# Patient Record
Sex: Female | Born: 1960 | Hispanic: No | Smoking: Never smoker
Health system: Southern US, Community
[De-identification: ages and names within clinical notes are randomized; demographics above are authoritative.]

## PROBLEM LIST (undated history)

## (undated) DIAGNOSIS — F419 Anxiety disorder, unspecified: Secondary | ICD-10-CM

## (undated) DIAGNOSIS — M1990 Unspecified osteoarthritis, unspecified site: Secondary | ICD-10-CM

## (undated) HISTORY — DX: Unspecified osteoarthritis, unspecified site: M19.90

## (undated) HISTORY — PX: TUBAL LIGATION: SHX77

## (undated) HISTORY — DX: Anxiety disorder, unspecified: F41.9

---

## 2008-12-24 ENCOUNTER — Ambulatory Visit (HOSPITAL_COMMUNITY): Admission: RE | Admit: 2008-12-24 | Discharge: 2008-12-24 | Payer: Self-pay | Admitting: Obstetrics and Gynecology

## 2008-12-24 ENCOUNTER — Encounter (INDEPENDENT_AMBULATORY_CARE_PROVIDER_SITE_OTHER): Payer: Self-pay | Admitting: Obstetrics and Gynecology

## 2008-12-27 ENCOUNTER — Emergency Department (HOSPITAL_COMMUNITY): Admission: EM | Admit: 2008-12-27 | Discharge: 2008-12-28 | Payer: Self-pay | Admitting: Emergency Medicine

## 2009-06-20 HISTORY — PX: ABDOMINAL HYSTERECTOMY: SHX81

## 2009-06-23 IMAGING — CT CT ANGIO CHEST
2 of 7 series · 19 of 36 positions shown · IV contrast (APPLIED)
Comparison: None.

CLINICAL DATA: Chest pain.  Shortness of breath.

CT ANGIOGRAPHY CHEST 12/27/2008:
TECHNIQUE: Multidetector CT imaging of the chest using the
standard protocol during bolus administration of intravenous
contrast. Multiplanar reconstructed images including MIPs were
obtained and reviewed to evaluate the vascular anatomy.
Contrast: 80 ml [id number] IV.

[Series 8: pulm embolism 1.0 b25f thins · axial · 0.63mm/px · z∈[+927,+1209]mm · 17 of 314 slices shown]
[im 16/314  lung]
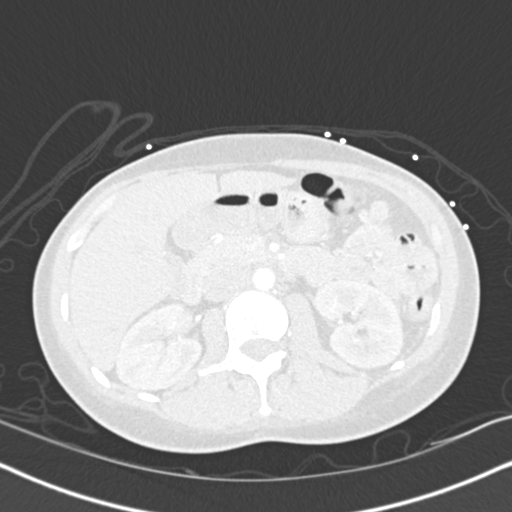
[im 32/314  mediastinal]
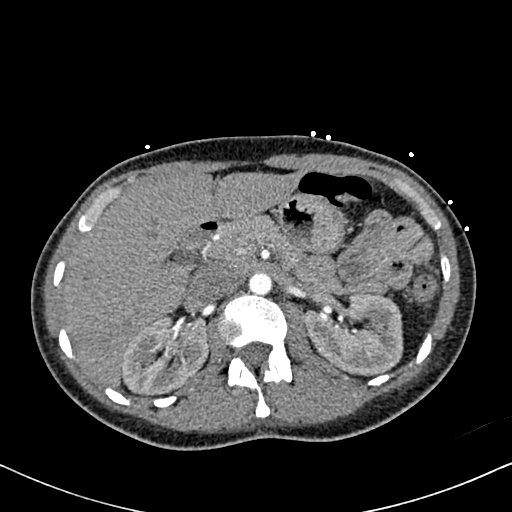
[im 47/314  lung]
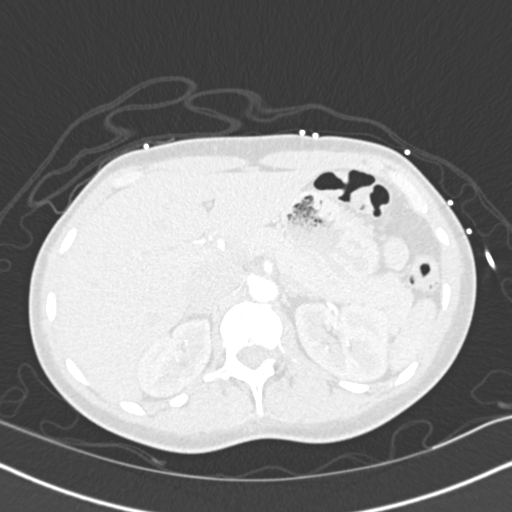
[im 63/314  mediastinal]
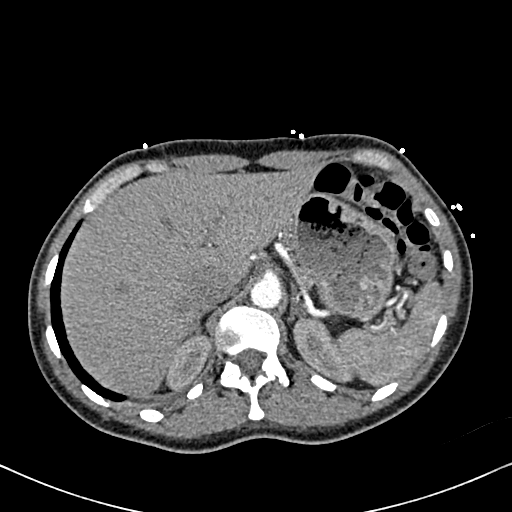
[im 94/314  lung]
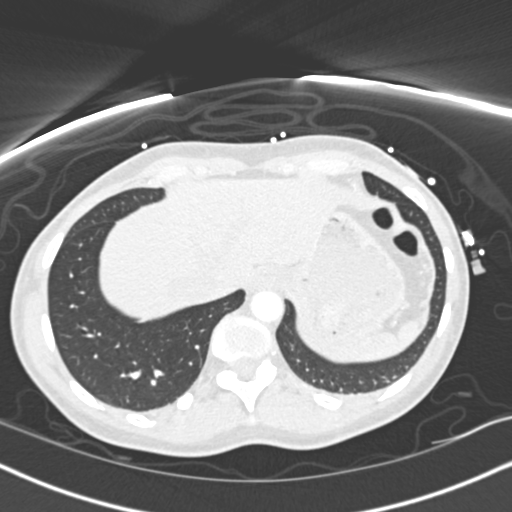
[im 110/314  mediastinal]
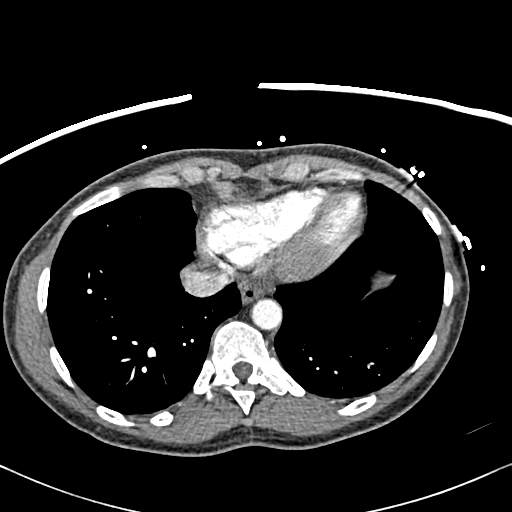
[im 126/314  lung]
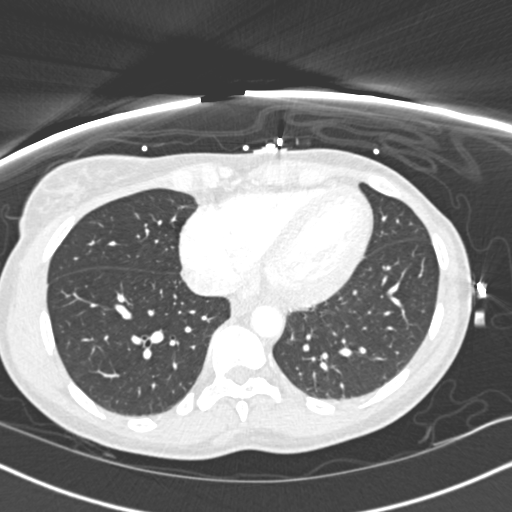
[im 141/314  mediastinal]
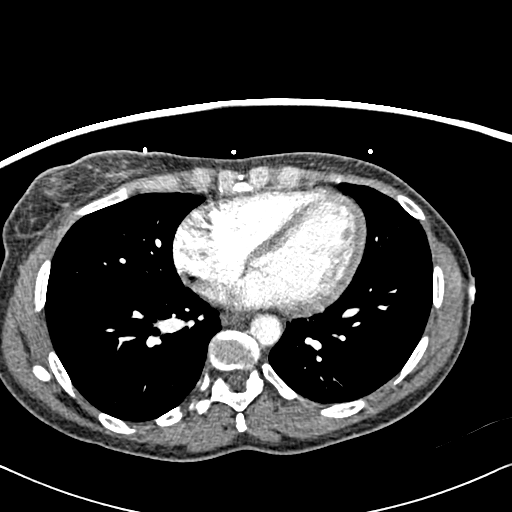
[im 157/314  lung]
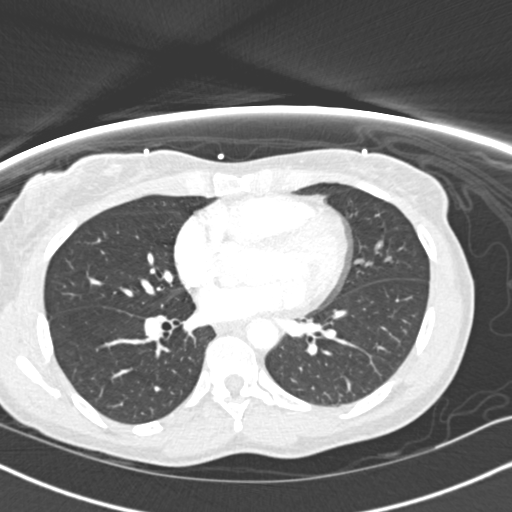
[im 173/314  mediastinal]
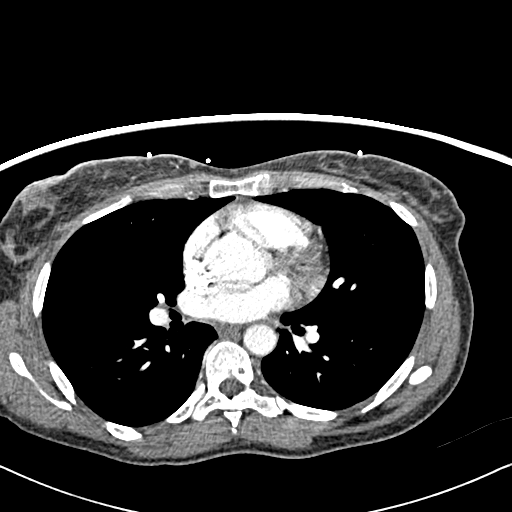
[im 188/314  lung]
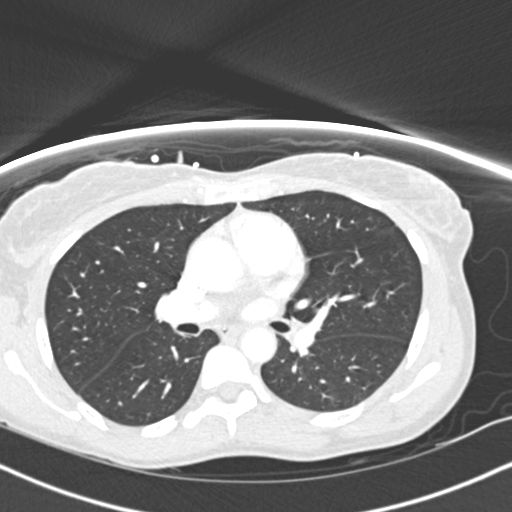
[im 204/314  mediastinal]
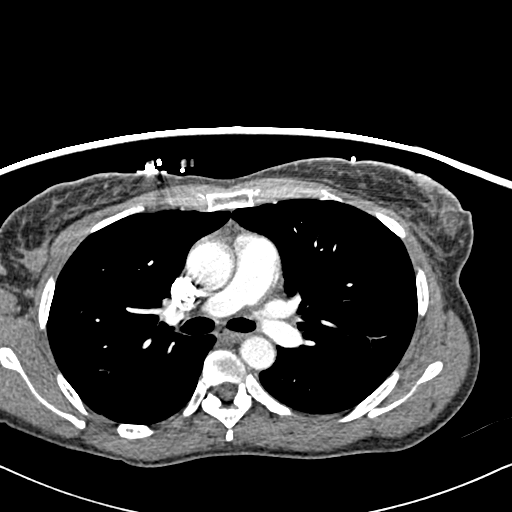
[im 220/314  lung]
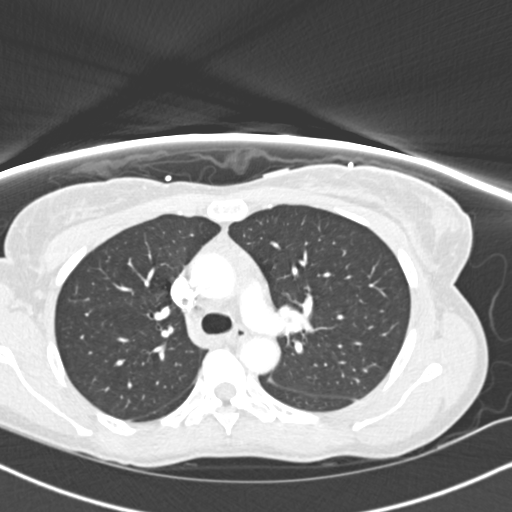
[im 251/314  mediastinal]
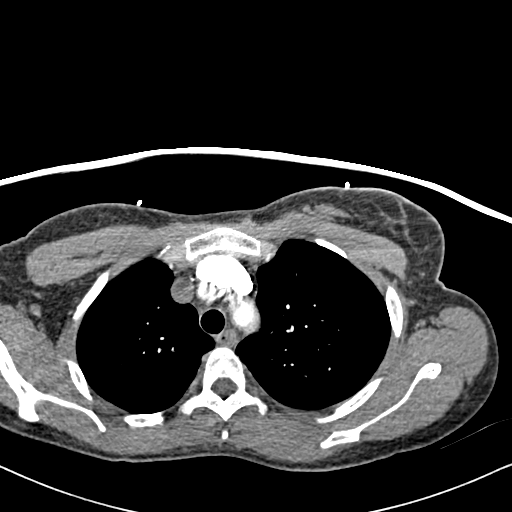
[im 267/314  lung]
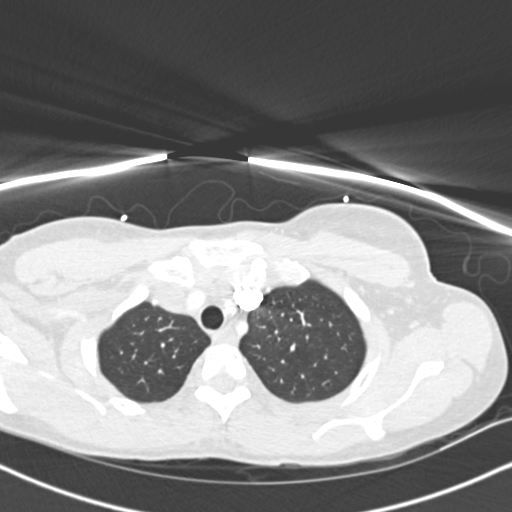
[im 282/314  mediastinal]
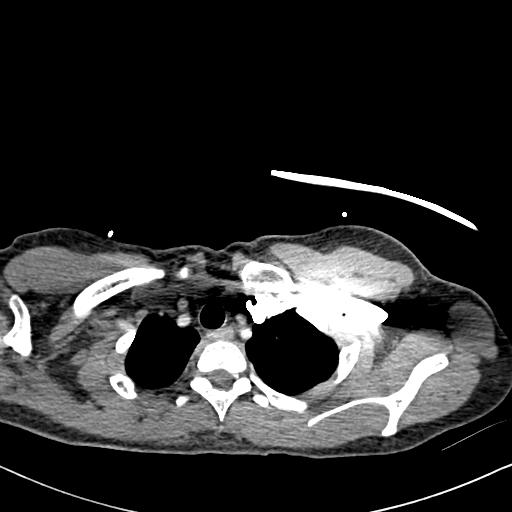
[im 298/314  lung]
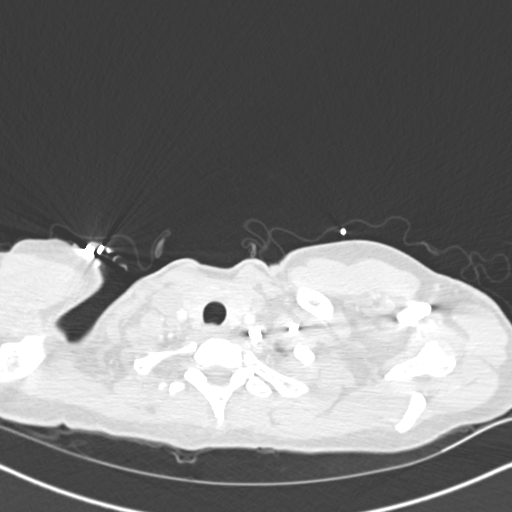

[Series 9: pulm embolism 2.0 spo thins · coronal · 0.69mm/px · 2 of 94 slices shown]
[im 32/94  mediastinal]
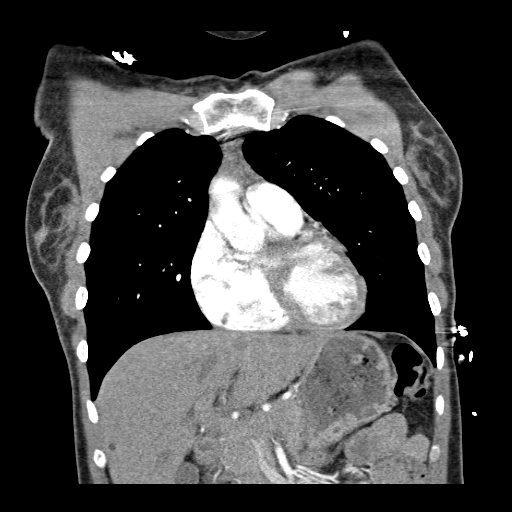
[im 63/94  mediastinal]
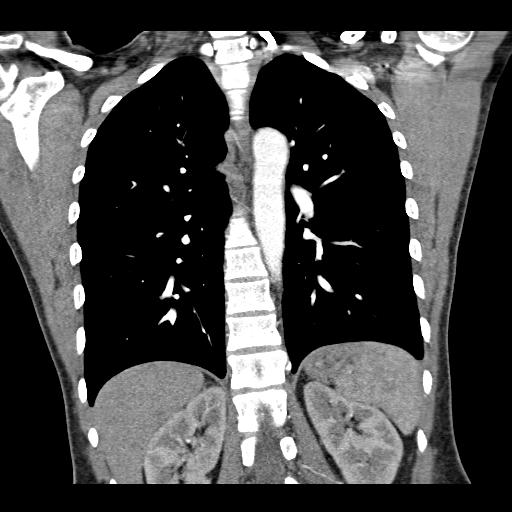

[19 of 36 positions shown; findings below may reference images not displayed]

FINDINGS: Contrast opacification of the pulmonary arteries is good,
yielding a good diagnostic quality study.  No filling defects
within either main pulmonary artery or their branches in either
lung to suggest pulmonary embolism.  Heart size normal.  No
pericardial effusion.  No visible coronary artery calcification.
Thoracic and upper abdominal aorta normal in appearance without
significant atherosclerotic disease.  No significant
lymphadenopathy.  Visualized thyroid gland unremarkable.

Pulmonary parenchyma clear without evidence of localized airspace
consolidation, interstitial lung disease, or nodularity.  No
pleural effusions.

Subcentimeter simple cyst in the medial segment left lobe of the
liver.  No significant abnormalities identified within the
visualized upper abdomen.  Bone window images unremarkable.
IMPRESSION: 1.  No evidence of pulmonary embolism.
2.  No acute cardiopulmonary disease.

## 2009-07-01 ENCOUNTER — Encounter (INDEPENDENT_AMBULATORY_CARE_PROVIDER_SITE_OTHER): Payer: Self-pay | Admitting: Obstetrics and Gynecology

## 2009-07-01 ENCOUNTER — Inpatient Hospital Stay (HOSPITAL_COMMUNITY): Admission: RE | Admit: 2009-07-01 | Discharge: 2009-07-03 | Payer: Self-pay | Admitting: Obstetrics and Gynecology

## 2010-03-01 ENCOUNTER — Emergency Department (HOSPITAL_COMMUNITY): Admission: EM | Admit: 2010-03-01 | Discharge: 2010-03-01 | Payer: Self-pay | Admitting: Emergency Medicine

## 2010-08-26 IMAGING — CT CT HEAD W/O CM
1 series · 16 of 30 positions shown, 20 images · non-contrast
Comparison: None.

CLINICAL DATA: .  Left side numbness and weakness.  Hypertension.

CT HEAD WITHOUT CONTRAST
TECHNIQUE: Contiguous axial images were obtained from the base of
the skull through the vertex without contrast

[Series 2: head routine 4.8 h37s · axial · 0.40mm/px · z∈[-197,-37]mm · 16 of 36 slices shown, 20 images]
[im 2/36  brain]
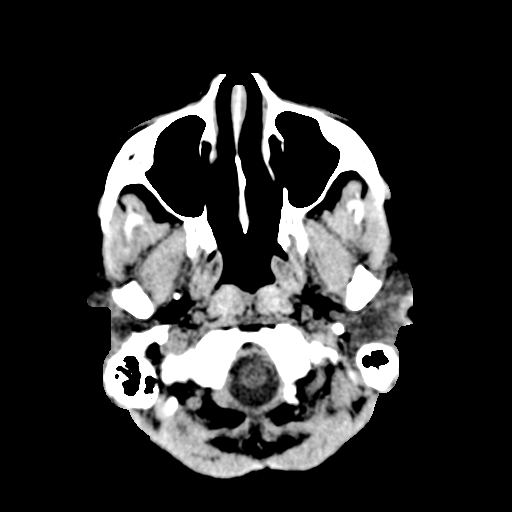
[im 2/36  bone]
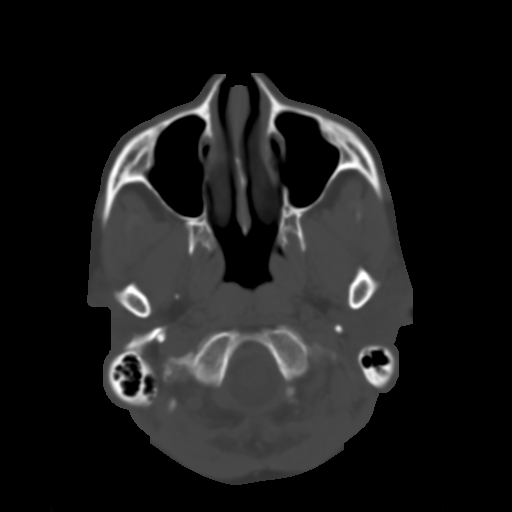
[im 4/36  brain]
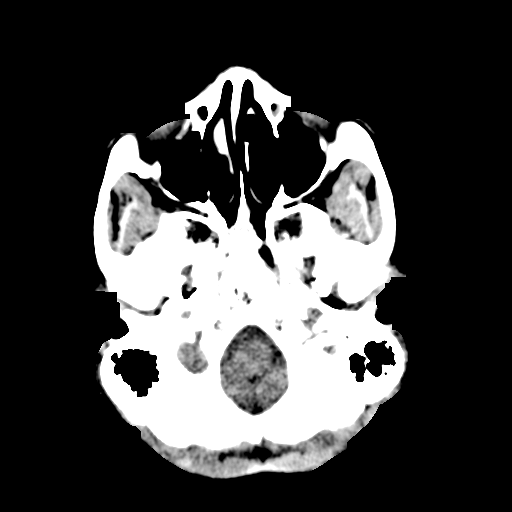
[im 7/36  brain]
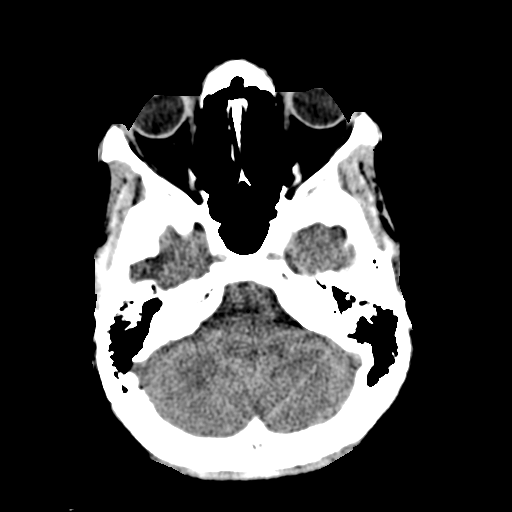
[im 9/36  brain]
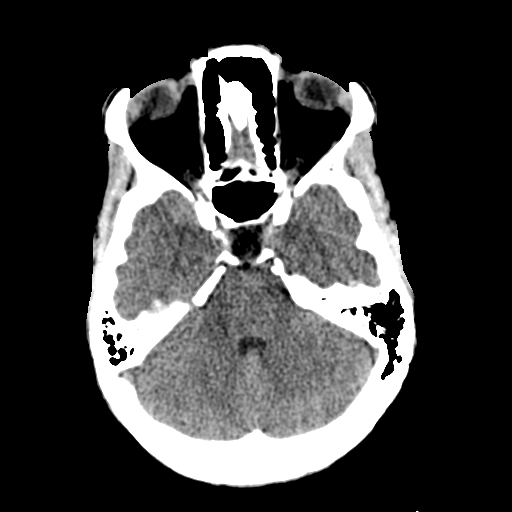
[im 10/36  brain]
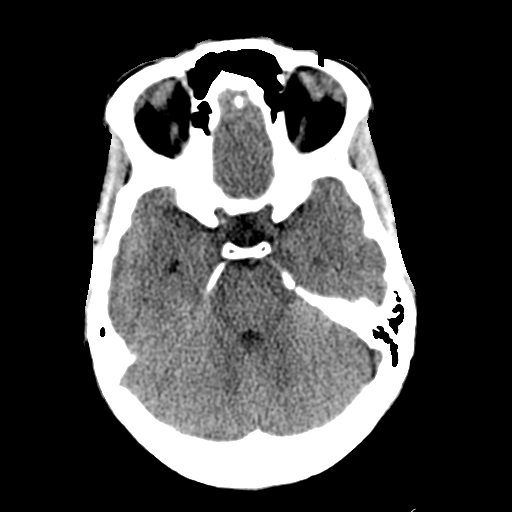
[im 10/36  bone]
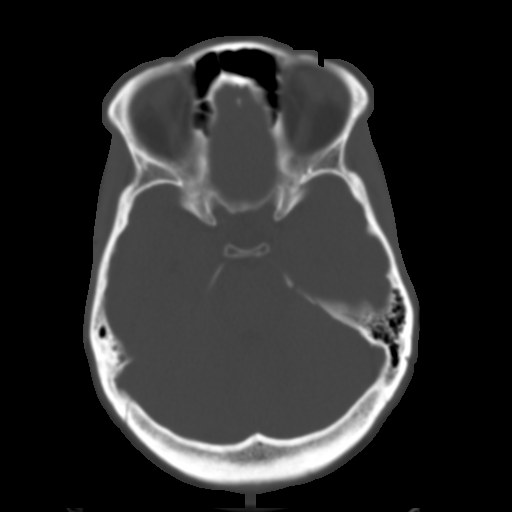
[im 13/36  brain]
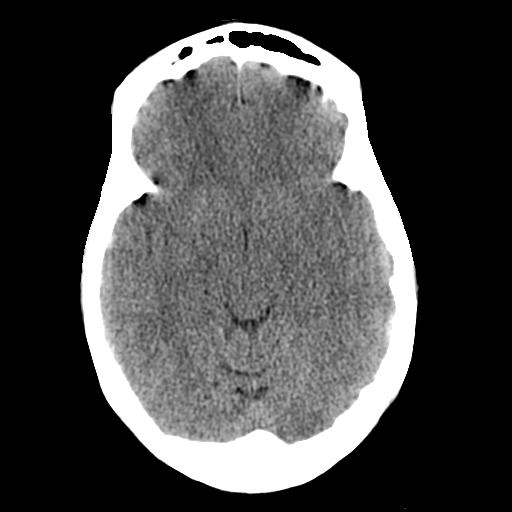
[im 15/36  brain]
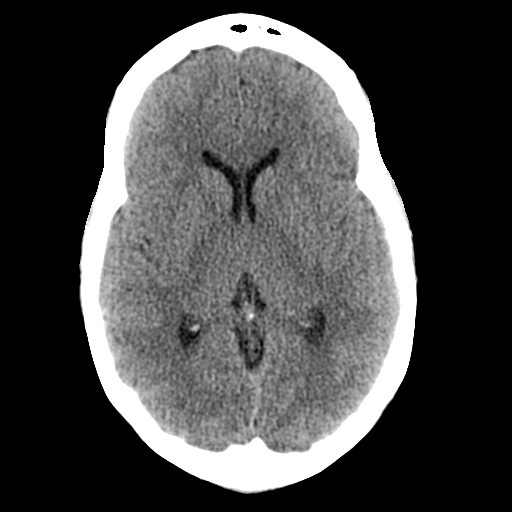
[im 17/36  brain]
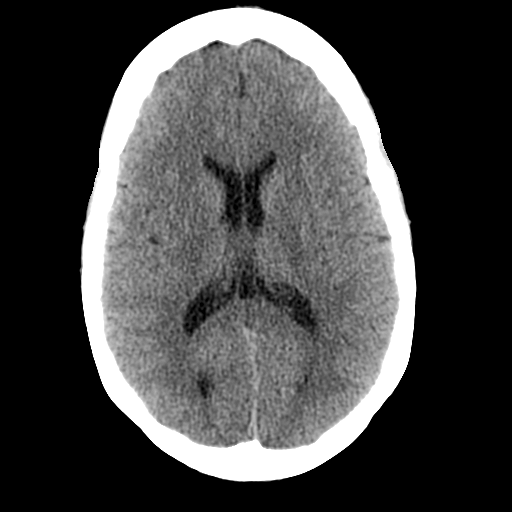
[im 19/36  brain]
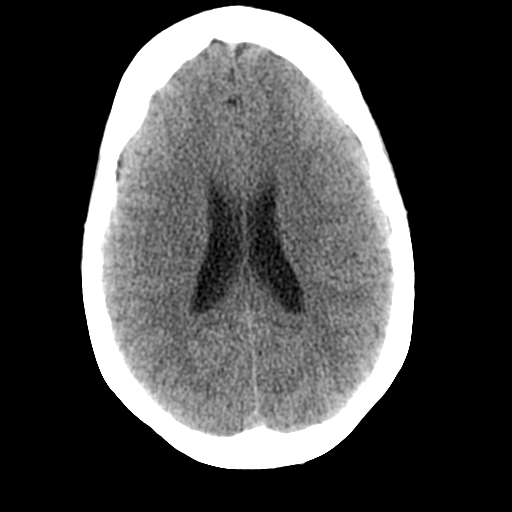
[im 19/36  bone]
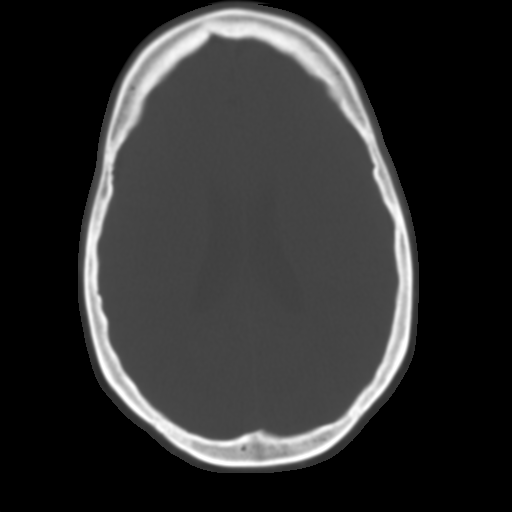
[im 21/36  brain]
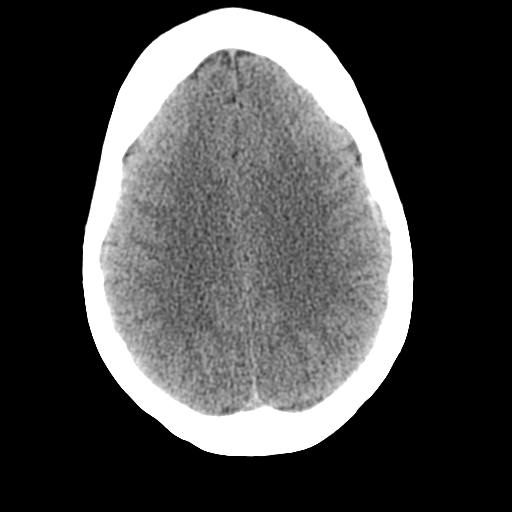
[im 23/36  brain]
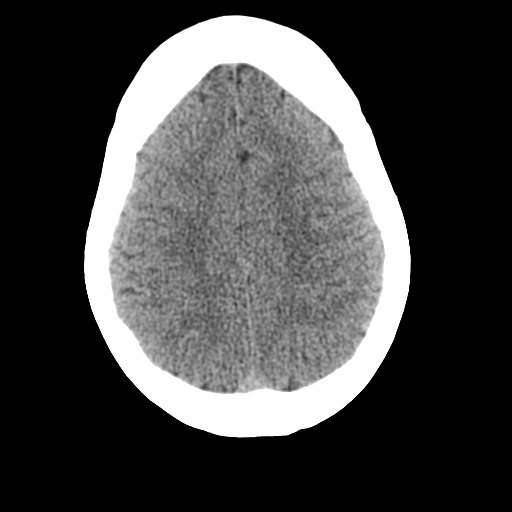
[im 26/36  brain]
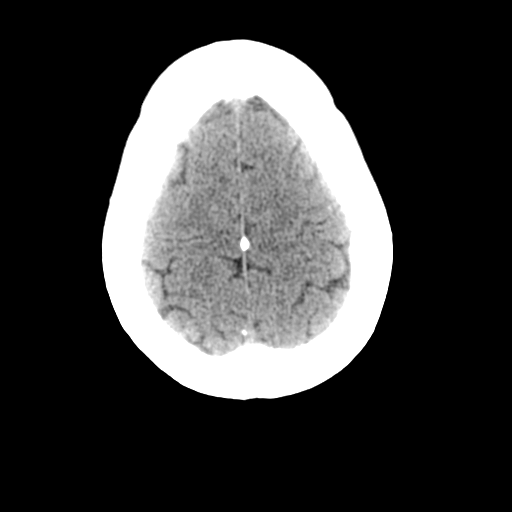
[im 27/36  brain]
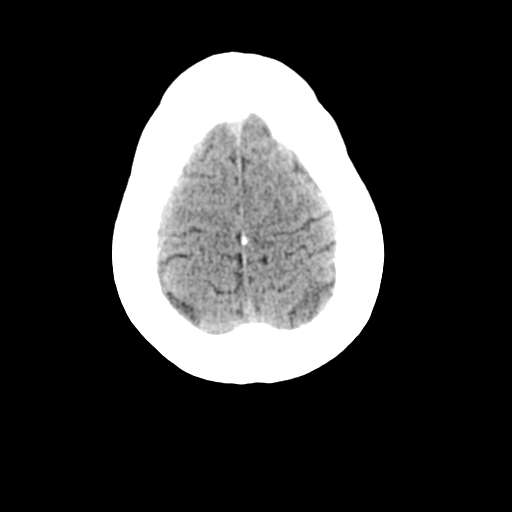
[im 27/36  bone]
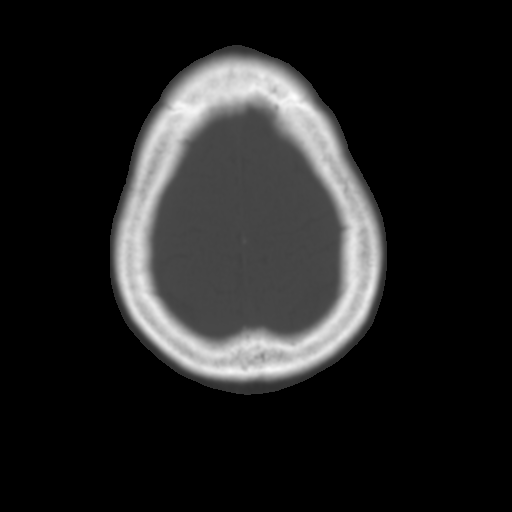
[im 29/36  brain]
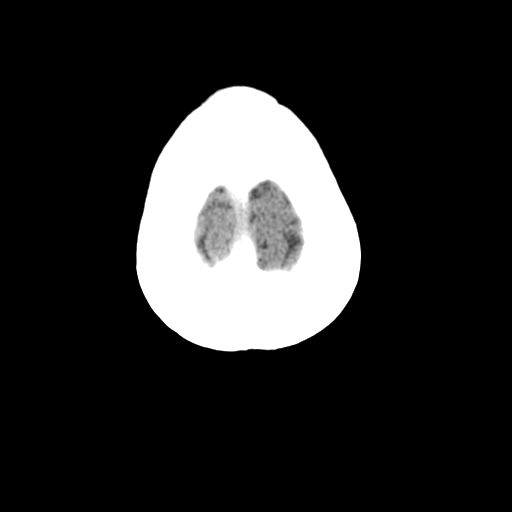
[im 32/36  brain]
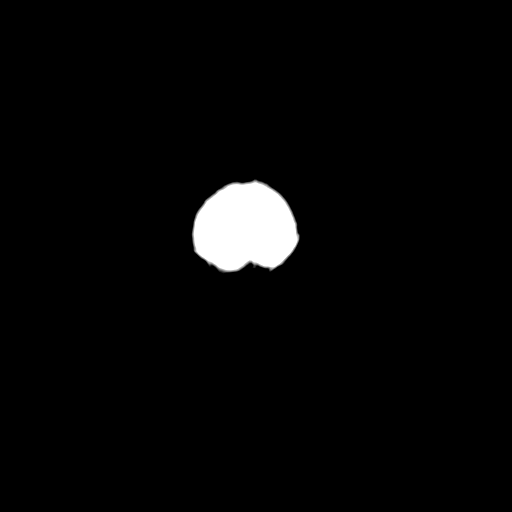
[im 34/36  brain]
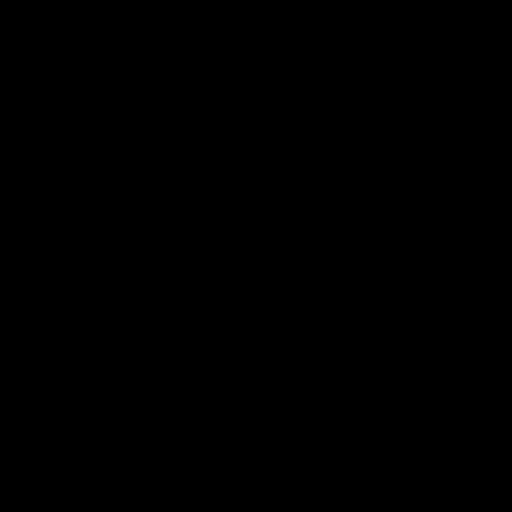

[16 of 30 positions shown; findings below may reference images not displayed]

FINDINGS: The brain has a normal appearance without evidence for
hemorrhage, acute infarction, hydrocephalus, or mass lesion.  There
is no extra axial fluid collection.  The skull and paranasal
sinuses are normal.
IMPRESSION: Normal CT of the head without contrast.

## 2011-03-16 LAB — COMPREHENSIVE METABOLIC PANEL
ALT: 16 U/L (ref 0–35)
AST: 22 U/L (ref 0–37)
Albumin: 3.5 g/dL (ref 3.5–5.2)
Alkaline Phosphatase: 55 U/L (ref 39–117)
BUN: 6 mg/dL (ref 6–23)
CO2: 29 mEq/L (ref 19–32)
Calcium: 8.8 mg/dL (ref 8.4–10.5)
Chloride: 101 mEq/L (ref 96–112)
Creatinine, Ser: 0.64 mg/dL (ref 0.4–1.2)
GFR calc Af Amer: >60 mL/min (ref >60)
GFR calc non Af Amer: >60 mL/min (ref >60)
Glucose, Bld: 113 mg/dL — ABNORMAL HIGH (ref 70–99)
Potassium: 3.3 mEq/L — ABNORMAL LOW (ref 3.5–5.1)
Sodium: 134 mEq/L — ABNORMAL LOW (ref 135–145)
Total Bilirubin: 1 mg/dL (ref 0.3–1.2)
Total Protein: 6.6 g/dL (ref 6.0–8.3)

## 2011-03-16 LAB — CBC
HCT: 36.4 % (ref 36.0–46.0)
Hemoglobin: 12.3 g/dL (ref 12.0–15.0)
MCHC: 33.8 g/dL (ref 30.0–36.0)
MCV: 80.8 fL (ref 78.0–100.0)
Platelets: 142 10*3/uL — ABNORMAL LOW (ref 150–400)
RBC: 4.5 MIL/uL (ref 3.87–5.11)
RDW: 15.2 % (ref 11.5–15.5)
WBC: 4.9 10*3/uL (ref 4.0–10.5)

## 2011-03-29 LAB — CBC
HCT: 33.7 % — ABNORMAL LOW (ref 36.0–46.0)
HCT: 44 % (ref 36.0–46.0)
Hemoglobin: 11.3 g/dL — ABNORMAL LOW (ref 12.0–15.0)
Hemoglobin: 15.2 g/dL — ABNORMAL HIGH (ref 12.0–15.0)
MCHC: 33.6 g/dL (ref 30.0–36.0)
MCHC: 34.5 g/dL (ref 30.0–36.0)
MCV: 79.1 fL (ref 78.0–100.0)
MCV: 81.8 fL (ref 78.0–100.0)
Platelets: 132 10*3/uL — ABNORMAL LOW (ref 150–400)
Platelets: 160 10*3/uL (ref 150–400)
RBC: 4.12 MIL/uL (ref 3.87–5.11)
RBC: 5.57 MIL/uL — ABNORMAL HIGH (ref 3.87–5.11)
RDW: 14.6 % (ref 11.5–15.5)
RDW: 14.9 % (ref 11.5–15.5)
WBC: 4.2 10*3/uL (ref 4.0–10.5)
WBC: 7.6 10*3/uL (ref 4.0–10.5)

## 2011-03-29 LAB — BASIC METABOLIC PANEL
BUN: 5 mg/dL — ABNORMAL LOW (ref 6–23)
CO2: 31 mEq/L (ref 19–32)
Calcium: 9.4 mg/dL (ref 8.4–10.5)
Chloride: 103 mEq/L (ref 96–112)
Creatinine, Ser: 0.59 mg/dL (ref 0.4–1.2)
GFR calc Af Amer: >60 mL/min (ref >60)
GFR calc non Af Amer: >60 mL/min (ref >60)
Glucose, Bld: 86 mg/dL (ref 70–99)
Potassium: 4.1 mEq/L (ref 3.5–5.1)
Sodium: 139 mEq/L (ref 135–145)

## 2011-03-29 LAB — PREGNANCY, URINE: Preg Test, Ur: NEGATIVE

## 2011-04-06 LAB — COMPREHENSIVE METABOLIC PANEL
ALT: 13 U/L (ref 0–35)
AST: 17 U/L (ref 0–37)
Albumin: 3.8 g/dL (ref 3.5–5.2)
Alkaline Phosphatase: 35 U/L — ABNORMAL LOW (ref 39–117)
BUN: 10 mg/dL (ref 6–23)
CO2: 27 mEq/L (ref 19–32)
Calcium: 9.1 mg/dL (ref 8.4–10.5)
Chloride: 105 mEq/L (ref 96–112)
Creatinine, Ser: 0.57 mg/dL (ref 0.4–1.2)
GFR calc Af Amer: >60 mL/min (ref >60)
GFR calc non Af Amer: >60 mL/min (ref >60)
Glucose, Bld: 89 mg/dL (ref 70–99)
Potassium: 3.8 mEq/L (ref 3.5–5.1)
Sodium: 137 mEq/L (ref 135–145)
Total Bilirubin: 1.3 mg/dL — ABNORMAL HIGH (ref 0.3–1.2)
Total Protein: 7.2 g/dL (ref 6.0–8.3)

## 2011-04-06 LAB — CBC
HCT: 37.8 % (ref 36.0–46.0)
HCT: 36.6 % (ref 36.0–46.0)
Hemoglobin: 12.4 g/dL (ref 12.0–15.0)
Hemoglobin: 12.1 g/dL (ref 12.0–15.0)
MCHC: 32.9 g/dL (ref 30.0–36.0)
MCHC: 33 g/dL (ref 30.0–36.0)
MCV: 80.2 fL (ref 78.0–100.0)
MCV: 78.9 fL (ref 78.0–100.0)
Platelets: 181 10*3/uL (ref 150–400)
Platelets: 180 10*3/uL (ref 150–400)
RBC: 4.71 MIL/uL (ref 3.87–5.11)
RBC: 4.64 MIL/uL (ref 3.87–5.11)
RDW: 15.4 % (ref 11.5–15.5)
RDW: 15.5 % (ref 11.5–15.5)
WBC: 4.2 10*3/uL (ref 4.0–10.5)
WBC: 4.2 10*3/uL (ref 4.0–10.5)

## 2011-04-06 LAB — POCT I-STAT, CHEM 8
BUN: 8 mg/dL (ref 6–23)
Calcium, Ion: 1.2 mmol/L (ref 1.12–1.32)
Chloride: 106 mEq/L (ref 96–112)
Creatinine, Ser: 0.7 mg/dL (ref 0.4–1.2)
Glucose, Bld: 90 mg/dL (ref 70–99)
HCT: 39 % (ref 36.0–46.0)
Hemoglobin: 13.3 g/dL (ref 12.0–15.0)
Potassium: 4.2 mEq/L (ref 3.5–5.1)
Sodium: 141 mEq/L (ref 135–145)
TCO2: 25 mmol/L (ref 0–100)

## 2011-04-06 LAB — DIFFERENTIAL
Basophils Absolute: 0 10*3/uL (ref 0.0–0.1)
Basophils Relative: 1 % (ref 0–1)
Eosinophils Absolute: 0 10*3/uL (ref 0.0–0.7)
Eosinophils Relative: 0 % (ref 0–5)
Lymphocytes Relative: 54 % — ABNORMAL HIGH (ref 12–46)
Lymphs Abs: 2.2 10*3/uL (ref 0.7–4.0)
Monocytes Absolute: 0.5 10*3/uL (ref 0.1–1.0)
Monocytes Relative: 12 % (ref 3–12)
Neutro Abs: 1.4 10*3/uL — ABNORMAL LOW (ref 1.7–7.7)
Neutrophils Relative %: 34 % — ABNORMAL LOW (ref 43–77)

## 2011-04-06 LAB — POCT CARDIAC MARKERS
CKMB, poc: <1 ng/mL — ABNORMAL LOW (ref 1.0–8.0)
Myoglobin, poc: 28.2 ng/mL (ref 12–200)
Troponin i, poc: <0.05 ng/mL (ref 0.00–0.09)

## 2011-04-06 LAB — D-DIMER, QUANTITATIVE: D-Dimer, Quant: 1.28 ug/mL-FEU — ABNORMAL HIGH (ref 0.00–0.48)

## 2011-05-05 NOTE — Op Note (Signed)
[name], [name]              ACCOUNT NO.:  [id number]   MEDICAL RECORD NO.:  [id number]          PATIENT TYPE:  INP   LOCATION:  9320                          FACILITY:  [facility]   PHYSICIAN:  Guy Sandifer. Henderson Cloud, M.D. DATE OF BIRTH:  07-26-1961   DATE OF PROCEDURE:  07/01/2009  DATE OF DISCHARGE:                               OPERATIVE REPORT   PREOPERATIVE DIAGNOSES:  1. Severe endometriosis.  2. Uterine leiomyomata.   POSTOPERATIVE DIAGNOSES:  1. Severe endometriosis.  2. Uterine leiomyomata.   PROCEDURE:  Total abdominal hysterectomy with bilateral salpingo-  oophorectomy.   SURGEON:  Guy Sandifer. Henderson Cloud, MD   ASSISTANT:  Freddy Finner, MD   ANESTHESIA:  General with endotracheal intubation.   SPECIMENS:  Uterus, bilateral tubes and ovaries to Pathology.   ESTIMATED BLOOD LOSS:  100 mL.   INDICATIONS AND CONSENT:  This patient is a 50 year old single black  female G2, P2 status post tubal ligation with known severe endometriosis  and uterine leiomyomata.  Details are dictated in the history and  physical.  After careful discussion of the options, she is being  admitted for total abdominal hysterectomy with bilateral salpingo-  oophorectomy.  Potential risks and complications have been discussed  preoperatively including, but not limited to infection, organ damage,  bleeding requiring transfusion of blood products with HIV and hepatitis  acquisition, DVT, PE, pneumonia, fistula formation, pelvic pain,  postoperative dyspareunia.  Issues of menopause have been discussed.  All questions have been answered and consent is signed on the chart.   FINDINGS:  Appendix is normal.  Uterus is about 6-8 weeks in size.  Tubes and ovaries were densely adherent to the posterior uterine fundus  bilaterally.  Posterior and anterior cul-de-sacs were clear.   PROCEDURE IN DETAILS:  The patient was taken to the operating room,  where Dr. Vernie Ammons places bilateral ureteral stents, that is  dictated  under separate note.  These were left attached to an indwelling Foley.  Dr. Vernie Ammons finishes his procedure and I entered the room.  The patient  was sleep under general anesthesia with endotracheal intubation.  She  was identified.  She was placed from the dorsal lithotomy position into  the dorsal supine position.  She was then prepped abdominally and  vaginally and draped in a sterile fashion.  Time-out then undertaken.  Skin was entered through the previous Pfannenstiel scar with the scar  being taken out on the way in.  Dissection was carried out in layers to  the peritoneum.  Peritoneum was entered and extended superiorly and  inferiorly.  O'Sullivan-O'Connor retractor was placed, bladder blade was  placed, bowel was packed away and the upper blade was placed.  Kelly  clamps were placed in the proximal ligaments to elevate the uterus.  Careful inspection revealed posterior cul-de-sac to be clear.  Stents  can be palpated bilaterally clear of the area of surgery.  The left  round ligament was then ligated with a 0 Monocryl suture.  It was then  cauterized with a handheld bipolar cautery and taken down.  Anterior  leaf of the broad ligaments  was taken down along the vesicouterine  peritoneum.  Posterior leaf was bluntly perforated.  The left  infundibulopelvic ligament was then doubly cauterized with bipolar  cautery and taken down.  The uterine vessels on the left side were  skeletonized and again were taken down with cautery and transected.  Similar procedure was carried out on the right side.  Progressive bites  with the bipolar cautery used bilaterally down the cervix.  Curved  Heaney clamp was then used to take down the left uterosacral ligament  entering the left vaginal fornix.  This was ligated with 0 Monocryl  suture.  The stitch entering the uterus was held.  Similar procedure was  carried out on the right side.  The uterus with the cervix intact was  then removed  along with the tubes and ovaries.  The cuff was closed with  figure-of-eight 0 Monocryl sutures.  Good hemostasis was noted.  The  angle sutures were tied in the midline.  Appendix appeared normal.  Irrigation was carried out and all returns were clear.  Retractors and  packs were removed.  Anterior peritoneum was closed in running fashion  with 0 Monocryl suture, which was also used to reapproximate the  pyramidalis muscle in the midline.  Anterior rectus fascia was closed in  running fashion with the looped 0 PDS suture.  Skin was closed with  clips.  All sponge, instrument, and needle counts were correct.  The  stents were then removed intact bilaterally.  Foley catheter was left in  place.  The patient was awakened, taken to recovery room in stable  condition.      Guy Sandifer Henderson Cloud, M.D.  Electronically Signed     JET/MEDQ  D:  07/01/2009  T:  07/01/2009  Job:  [id number]

## 2011-05-05 NOTE — H&P (Signed)
[name], [name]              ACCOUNT NO.:  [id number]   MEDICAL RECORD NO.:  [id number]          PATIENT TYPE:  AMB   LOCATION:  SDC                           FACILITY:  [facility]   PHYSICIAN:  Guy Sandifer. Henderson Cloud, M.D. DATE OF BIRTH:  26-Jul-1961   DATE OF ADMISSION:  DATE OF DISCHARGE:                              HISTORY & PHYSICAL   CHIEF COMPLAINT:  Premenstrual pain and heavy menses.   HISTORY OF PRESENT ILLNESS:  This patient is a 50 year old divorced  black female G2, P2 status post tubal ligation who complains of  increasingly severe pelvic pain 6-10 days prior to menses.  She now has  a low-grade pelvic aches throughout the month.  Also, she is having  heavier menses.  Ultrasound in my office on July 23, 2008, revealed  uterus measuring 10.4 x 5.9 x 8.8 cm.  Sonohysterogram revealed an  irregular lining, but no distinct masses.  There was a 2.1 and 1.9 cm  intramural fibroids noted.  After discussion of options, she is being  admitted for laparoscopy, hysteroscopy, and D&C.  Potential risks and  complications have been discussed preoperatively.   PAST MEDICAL HISTORY:  History of cervical dysplasia status post  cervical conization.   PAST SURGICAL HISTORY:  1. Cervical conization as above.  2. Bilateral tubal ligation.   OBSTETRICAL HISTORY:  Vaginal delivery x2.   MEDICATIONS:  Vitamins.   ALLERGIES:  No known drug allergies.   SOCIAL HISTORY:  Denies tobacco, alcohol, or drug abuse.   FAMILY HISTORY:  Positive for sickle cell disease in son and diabetes in  sister, mother, grandparents, and father.   REVIEW OF SYSTEMS:  NEURO:  Has occasional headache.  PULMONARY:  Denies  shortness of breath.  CARDIAC:  Denies chest pain.   PHYSICAL EXAMINATION:  VITAL SIGNS:  Height 5 feet 4 inches, weight 132  pounds, and blood pressure 100/76.  LUNGS:  Clear to auscultation.  HEART:  Regular rate and rhythm.  ABDOMEN:  Soft and nontender without masses.  PELVIC:  Vagina  and cervix without lesion.  Uterus is about 10 weeks in  size, mildly tender.  Adnexa, nontender without masses.  EXTREMITIES:  Grossly within normal limits.  NEUROLOGIC:  Grossly within normal limits   ASSESSMENT:  Premenstrual pain and menorrhagia.   PLAN:  Laparoscopy, hysteroscopy, and D&C.      Guy Sandifer Henderson Cloud, M.D.  Electronically Signed     JET/MEDQ  D:  12/20/2008  T:  12/21/2008  Job:  [id number]

## 2011-05-05 NOTE — Op Note (Signed)
[name], SHELVIN              ACCOUNT NO.:  [id number]   MEDICAL RECORD NO.:  [id number]          PATIENT TYPE:  INP   LOCATION:  9320                          FACILITY:  [facility]   PHYSICIAN:  Mark C. Vernie Ammons, M.D.  DATE OF BIRTH:  May 23, 1961   DATE OF PROCEDURE:  07/01/2009  DATE OF DISCHARGE:                               OPERATIVE REPORT   LOCATION:  [facility].   PREOPERATIVE DIAGNOSES:  1. Endometriosis.  2. Need to more easily identify ureters.   SURGEON:  Mark C. Vernie Ammons, MD   PROCEDURES:  1. Cystoscopy with bilateral retrograde pyelogram including      interpretation.  2. Bilateral ureteral catheterization.   ANESTHESIA:  General.   BLOOD LOSS:  0.   DRAINS:  6-French open-ended ureteral catheters in right and left  ureters and a 16-French Foley catheter in the bladder.   SPECIMENS:  None.   COMPLICATIONS:  None.   INDICATIONS:  The patient is a 50 year old female with extensive  endometriosis, who is to undergo surgery for this and due to the  extensive nature of her disease and involvement of the retroperitoneum,  it was felt by Dr. Henderson Cloud that stent placement would be helpful in  identifying the ureters intraoperatively and I agree with that  completely.  I have discussed this procedure with the patient including  its risks and complications.  She understands and has elected to  proceed.   DESCRIPTION OF OPERATION:  After informed consent, the patient was  brought to the major OR, placed in table, administered general  anesthesia, then moved to the dorsal lithotomy position.  Genitalia was  sterilely prepped and draped.  A 22-French cystoscope was then  introduced in the bladder with a 12-degree lens.  The bladder was fully  inspected, noted to be free of any tumor, stones or inflammatory  lesions.  Ureteral orifices were of normal configuration and position  with clear reflux.  There was moderate trigonal squamous metaplasia  noted.   A 6-French  open-end ureteral catheter was then passed through the  cystoscope and into the right ureteral orifice.  Full-strength contrast  was then used to perform retrograde pyelogram in the standard fashion  and using 8 mL of contrast, the ureter was visualized throughout its  course using direct fluoroscopy and was noted to have no filling  defects, mass effect, or deviation in its course.  The collecting system  was noted to be normal.  There is no evidence of hydronephrosis.   A 0.038-inch floppy-tip guidewire was then passed into the area of the  renal pelvis under direct fluoroscopy and the open-ended catheter passed  up the right ureter into the area of the renal pelvis, and the guidewire  removed as well as the cystoscope leaving the stent in place.  This  identical procedure was performed on the left side with identical  findings being noted and an identical stent being placed.   The cystoscope was removed and a 16-French Foley catheter was placed in  the bladder and both stents were connected to close system drainage as  was the Foley  catheter.  The stents were affixed into the catheter and  the patient tolerated the procedure well with no intraoperative  complications.   After her operation, Dr. Henderson Cloud will remove the internal-external  stents.      Mark C. Vernie Ammons, M.D.  Electronically Signed     MCO/MEDQ  D:  07/01/2009  T:  07/01/2009  Job:  [id number]

## 2011-05-05 NOTE — Discharge Summary (Signed)
[name], [name]              ACCOUNT NO.:  [id number]   MEDICAL RECORD NO.:  [id number]          PATIENT TYPE:  INP   LOCATION:  9320                          FACILITY:  [facility]   PHYSICIAN:  Guy Sandifer. Henderson Cloud, M.D. DATE OF BIRTH:  12/26/1960   DATE OF ADMISSION:  07/01/2009  DATE OF DISCHARGE:  07/03/2009                               DISCHARGE SUMMARY   ADMITTING DIAGNOSES:  1. Severe endometriosis.  2. Uterine leiomyomata.   DISCHARGE DIAGNOSES:  1. Severe endometriosis.  2. Uterine leiomyomata.   PROCEDURE:  On July 01, 2009, total abdominal hysterectomy with  bilateral salpingo-oophorectomy.   REASON FOR ADMISSION:  This patient is a 50 year old black female G2, P2  status post tubal ligation with increasingly symptomatic endometriosis  and leiomyomata.  Details dictated in the history and physical.  She was  admitted for surgical management.   HOSPITAL COURSE:  The patient was admitted to the hospital and undergoes  the above procedure.  Estimated blood loss 100 mL.  On the evening of  surgery, she has good pain relief.  Urine output is clear.  Vital signs  are stable, afebrile, and abdomen is flat and soft with good bowel  sounds.  On the first postoperative day, she is getting ambulate.  She  does not yet pass flatus, but she is tolerating regular diet.  Vital  signs are stable, she is afebrile.  Hemoglobin is 11.3.  On the day of  discharge, she has good pain control, passing flatus, ambulating,  tolerating regular diet.  Vital signs remain afebrile and her incision  is healing well.  Pathology is pending.   CONDITION ON DISCHARGE:  Good.   DIET:  Regular as tolerated.   ACTIVITY:  No lifting, no operation of automobiles, and no vaginal  entry.  She is to call our office for problems including but not limited  to temperature of 101 degrees, persistent nausea, vomiting, increasing  pain, or heavy vaginal bleeding.   MEDICATIONS:  1. Percocet 5/325 mg, #34, 1-2  p.o. q.6 h. p.r.n.  2. Ibuprofen 600 mg q.6 h. p.r.n.  3. Multivitamin daily.   FOLLOWUP:  Follow up is in the office in 2 weeks.      Guy Sandifer Henderson Cloud, M.D.  Electronically Signed     JET/MEDQ  D:  07/03/2009  T:  07/04/2009  Job:  [id number]   cc:   Veverly Fells. Vernie Ammons, M.D.  Fax: [phone]

## 2011-05-05 NOTE — H&P (Signed)
[name], WARTMAN              ACCOUNT NO.:  [id number]   MEDICAL RECORD NO.:  [id number]          PATIENT TYPE:  AMB   LOCATION:  SDC                           FACILITY:  [facility]   PHYSICIAN:  Guy Sandifer. Henderson Cloud, M.D. DATE OF BIRTH:  [birth year]   DATE OF ADMISSION:  DATE OF DISCHARGE:                              HISTORY & PHYSICAL   CHIEF COMPLAINT:  Pelvic pain.   HISTORY OF PRESENT ILLNESS:  The patient is a 50 year old single black  female G2, P2, status post tubal ligation who underwent laparoscopy on  December 24, 2008.  At that time, she was noted to have dense pelvic  adhesions and severe endometriosis.  Uterus is about 8 weeks in size  with leiomyomata and both ovaries and tubes are adherent to the  posterior uterus and posterior cul-de-sac.  She has continued to have  pelvic pain and in fact the pain has got steadily worse.  It is greater  on the right than on the left.  After discussion of options, she is  being admitted for total abdominal hysterectomy with bilateral salpingo-  oophorectomy.  Risks and benefits have been reviewed as well as the  issues of menopause.  Ureteral stents will be placed by the urologist  preoperatively.   PAST MEDICAL HISTORY:  Cervical dysplasia.   PAST SURGICAL HISTORY:  1. Cervical conization.  2. Bilateral tubal ligation.  3. Laparoscopy.   OBSTERIC HISTORY:  Vaginal delivery x2.   MEDICATIONS:  1. Vitamins.  2. Percocet 1-2 q.6 h. p.r.n.   ALLERGIES:  No known drug allergies.   SOCIAL HISTORY:  Denies tobacco, alcohol, or drug abuse.   FAMILY HISTORY:  Positive for sickle cell disease in son.  Diabetes in  sister, mother, grandparents, and father.   REVIEW OF SYSTEMS:  NEUROLOGIC:  Occasional headache.  PULMONARY:  Denies shortness of breath.  CARDIAC:  Denies chest pain.   PHYSICAL EXAMINATION:  VITAL SIGNS:  Height 5 feet 4 inches, weight  117.4 pounds, and blood pressure 102/62.  LUNGS:  Clear to auscultation.  HEART:   Regular rate and rhythm.  ABDOMEN:  Soft with mild right lower quadrant tenderness.  EXTREMITIES:  Grossly within normal limits.  NEUROLOGIC:  Grossly within normal limits.  PELVIC:  Deferred.   ASSESSMENT:  Severe endometriosis and uterine leiomyomata.   PLAN:  Total abdominal hysterectomy with bilateral salpingo-  oophorectomy.      Guy Sandifer Henderson Cloud, M.D.  Electronically Signed     JET/MEDQ  D:  06/28/2009  T:  06/29/2009  Job:  [id number]

## 2011-05-05 NOTE — Op Note (Signed)
[name], [name]              ACCOUNT NO.:  [id number]   MEDICAL RECORD NO.:  [id number]          PATIENT TYPE:  AMB   LOCATION:  SDC                           FACILITY:  [facility]   PHYSICIAN:  Guy Sandifer. Henderson Cloud, M.D. DATE OF BIRTH:  April 07, 1961   DATE OF PROCEDURE:  12/24/2008  DATE OF DISCHARGE:                               OPERATIVE REPORT   PREOPERATIVE DIAGNOSIS:  Premenstrual pain and menorrhagia.   POSTOPERATIVE DIAGNOSES:  Endometriosis, uterine leiomyomata, pelvic  adhesions, right paratubal cyst, and menorrhagia.   PROCEDURE:  Laparoscopy with uterine myomectomy, ablation of  endometriosis, resection of right paratubal cyst, left ovarian biopsy,  hysteroscopy, dilatation and curettage.   SURGEON:  Guy Sandifer. Henderson Cloud, MD   ANESTHESIA:  General via endotracheal intubation by Angelica Pou, MD   SPECIMENS:  Endometrial curettings, left ovarian biopsy, and right  paratubal cyst to Pathology.   ESTIMATED BLOOD LOSS:  Minimal.  I and O's of distending media 30 mL  deficit.   INDICATIONS AND CONSENT:  This patient is a 50 year old divorced black  female, G2, P2, status post tubal ligation with increasingly severe  pelvic pain 6-10 days prior to her menses.  She now has pelvic aches  through the month as well as heavy menses.  Details dictated in the  history and physical.  Laparoscopy, hysteroscopy, dilatation and  curettage were discussed preoperatively.  Potential risks and  complications were reviewed preoperatively including but limited to  infection, uterine perforation, organ damage, bleeding requiring  transfusion of blood products with HIV and hepatitis acquisition, DVT,  PE, pneumonia, recurrent pain or heavy bleeding, laparotomy.  All  questions were answered and consent was signed on the chart.   FINDINGS:  Endometrial cavity is without abnormal structures.  Abdominally, the upper abdomen is grossly normal.  The appendix is  normal.  Uterus is about 8 weeks in  size.  Both ovaries are adherent to  the posterior lower uterine segment.  The right fallopian tube was  adherent to the right side of the uterine fundus and there is a 3-cm  paratubal cyst adherent there as well.  The left ovary is adherent to  the side of the uterus and also is adherent to the cervical insertion of  the left uterosacral ligament.  There is a 3-cm pedunculated subserosal  fibroid on the left anterior lower uterine segment.  There is a dark  brown implant of endometriosis at the cervical insertion of the left  uterosacral ligament.   PROCEDURE:  The patient was taken to operating room where she was  identified, placed in dorsal supine position, and general anesthesia was  induced via endotracheal intubation.  She was then placed in dorsal  lithotomy position where she was prepped abdominally and vaginally.  Bladder was straight catheterized, and she was draped in the sterile  fashion.  Bivalve speculum was placed in the vagina.  The anterior  cervical lip was injected with 1% Xylocaine and grasped with single-  tooth tenaculum.  Paracervical block at the 2, 4, 5, 7, 8, and 10  o'clock positions with approximately 20 mL of  the same solution was  placed.  Cervix was gently progressively dilated.  Diagnostic  hysteroscope was placed in the endocervical canal and advanced under  direct visualization with distending media.  The above findings were  noted.  Sharp curettage was carried out.  The hysteroscope was replaced  in the endometrial cavity and again inspection reveals the same  findings.  Hysteroscope was withdrawn.  The single-tooth tenaculum was  replaced with a Hulka tenaculum and attention was turned to the abdomen.  The infraumbilical and suprapubic areas were injected in the midline  with approximately 8 mL of 0.5% plain Marcaine.  A small infraumbilical  incision was made and a disposable Veress needle was placed on first  attempt without difficulty.  A normal  syringe and drop test were noted.  Two liters of gas were then insufflated under low pressure with good  tympany in the right upper quadrant.  Veress needle was removed and then  a 10/11 Xcel bladeless disposable trocar sleeve was placed using direct  visualization with the diagnostic laparoscope.  After placement, the  operative laparoscope was placed.  A small suprapubic incision was made  and a 5-mm Xcel bladeless disposable trocar sleeve was placed under  direct visualization without difficulty.  The above findings were noted.  Using scissors with cautery, the fibroid on the left anterior lower  uterine segment was sharply resected without difficulty.  Good  hemostasis was maintained.  This was retrieved and delivered from the  peritoneal cavity.  The paratubal cyst on the right side, adherent to  the side of the uterus was then resected.  Deflated in this process.  It  sharply resected and sent to Pathology as well.  Bipolar cautery was  used on this area to assure complete hemostasis.  The adhesion of the  left ovary to the from its pole to the cervical insertion of the left  uterosacral ligament was taken down sharply.  Good hemostasis was noted.  This leaves a 1-cm fragment of the ovary adherent to the left lower  uterine segment.  This was sharply resected and sent to pathology.  Good  hemostasis was noted.  After this was done and then covers a dark  implant of endometriosis on the distal end of the uterosacral ligament  at this point of insertion of the cervix.  This was cauterized.  Attention was paid to the ureter to stay well, clear of the ureter while  doing this.  At this point, 10 mL of 0.5% plain Marcaine was instilled  into the peritoneal cavity.  The inferior trocar sleeve was removed and  no bleeding was noted.  Pneumoperitoneum was reduced and the umbilical  trocar sleeve was removed.  Skin incisions were closed with subcuticular  3-0 Vicryl suture.  Dermabond was  used to close both incisions.  Hulka  tenaculum was removed and no bleeding was noted.  All counts were  correct.  The patient was awakened, taken to recovery room in stable  condition.      Guy Sandifer Henderson Cloud, M.D.  Electronically Signed     JET/MEDQ  D:  12/24/2008  T:  12/24/2008  Job:  [id number]

## 2018-06-13 ENCOUNTER — Ambulatory Visit: Payer: Non-veteran care | Admitting: Physical Therapy

## 2018-06-14 ENCOUNTER — Other Ambulatory Visit: Payer: Self-pay

## 2018-06-14 ENCOUNTER — Ambulatory Visit: Payer: No Typology Code available for payment source | Attending: Family | Admitting: Physical Therapy

## 2018-06-14 ENCOUNTER — Encounter: Payer: Self-pay | Admitting: Physical Therapy

## 2018-06-14 DIAGNOSIS — M6281 Muscle weakness (generalized): Secondary | ICD-10-CM | POA: Diagnosis present

## 2018-06-14 DIAGNOSIS — R252 Cramp and spasm: Secondary | ICD-10-CM | POA: Insufficient documentation

## 2018-06-14 DIAGNOSIS — R279 Unspecified lack of coordination: Secondary | ICD-10-CM | POA: Insufficient documentation

## 2018-06-14 NOTE — Patient Instructions (Signed)
   Guided Meditation for Pelvic Floor Relaxation  [employer]    Pelvic Floor Release Stretches (NEW)  [employer]  [facility] [address], [address] [place], [place] [address] Phone # [phone] Fax [phone]

## 2018-06-14 NOTE — Therapy (Signed)
[facility] [address] [place], [place], [address] Phone: [phone]   Fax:  [phone]  Physical Therapy Evaluation  Patient Details  Name: [name] MRN: [id number] Date of Birth: 15-Feb-1961 Referring Provider: Dr. Ezequiel GanserAngelique Guiteau-Laurent   Encounter Date: 06/14/2018  PT End of Session - 06/14/18 1514    Visit Number  1    Date for PT Re-Evaluation  08/09/18    Authorization Type  VA needs to use 10 visits by 10/12/2018    Authorization - Visit Number  1    Authorization - Number of Visits  10    PT Start Time  1100    PT Stop Time  1145    PT Time Calculation (min)  45 min    Activity Tolerance  Patient tolerated treatment well    Behavior During Therapy  [facility] for tasks assessed/performed       Past Medical History:  Diagnosis Date  . Anxiety   . Arthritis     Past Surgical History:  Procedure Laterality Date  . ABDOMINAL HYSTERECTOMY  06/20/2009   full  . TUBAL LIGATION      There were no vitals filed for this visit.   Subjective Assessment - 06/14/18 1109    Subjective  Patient saw Pelvic floor PT for 1 session. Patient had a full hysterectomy but did not take the pain away  Patient had a pelvic infection 33 years ago. urinary leakage on the way to the bathroom.  Patient uses the bathroom every hour. Patient has to get up 2 times per night.  Patient goes to the bathroom daily due to taking medication. Prior to medication would have a bowel movement every 3 days.     Patient Stated Goals  decrease pain, use the bathroom without medication every other day, return to exercise for her core with less pain    Currently in Pain?  Yes    Pain Score  6     Pain Location  Abdomen    Pain Orientation  Lower    Pain Descriptors / Indicators  Sharp;Aching    Pain Type  Chronic pain    Pain Frequency  Constant    Aggravating Factors   core strength; vaginal exam; bending over     Pain Relieving  Factors  alleeve, heat    Multiple Pain Sites  No         [facility] PT Assessment - 06/14/18 0001      Assessment   Medical Diagnosis  R10.2 pelvic and perineal pain    Referring Provider  Dr. Ezequiel GanserAngelique Guiteau-Laurent    Onset Date/Surgical Date  06/20/09    Prior Therapy  yes 1 session      Precautions   Precautions  None      Restrictions   Weight Bearing Restrictions  No      Home Environment   Living Environment  Private residence      Prior Function   Level of Independence  Independent      Cognition   Overall Cognitive Status  Within Functional Limits for tasks assessed      Observation/Other Assessments   Focus on Therapeutic Outcomes (FOTO)   53% limitation; goal is 43% limitation      Posture/Postural Control   Posture/Postural Control  No significant limitations      ROM / Strength   AROM / PROM / Strength  AROM;PROM;Strength      AROM   Lumbar Flexion  decreased by 25%    Lumbar Extension  decreased by 50%      Strength   Right Hip ABduction  3+/5    Right Hip ADduction  3+/5      Palpation   SI assessment   right ilium anteriorly rotated    Palpation comment  tenderness located throughout the abdomen, lumbar paraspinals;        Transfers   Transfers  Not assessed      Ambulation/Gait   Ambulation/Gait  No                Objective measurements completed on examination: See above findings.    Pelvic Floor Special Questions - 06/14/18 0001    Currently Sexually Active  No no penetration    Urinary Leakage  Yes    Activities that cause leaking  Other    Other activities that cause leaking  on the way to the bathroom    Urinary frequency  every hour    Skin Integrity  Intact dry    Perineal Body/Introitus   Elevated    Pelvic Floor Internal Exam  Patient confirms identification and approves PT to assess pelvic floor and treat    Exam Type  Vaginal    Palpation  tenderness located in the vulva, only able to have PT index finger  into  the introitus but no further due to pain    Strength  -- unable to test due to pain    Tone  increased tone               PT Education - 06/14/18 1514    Education Details  you tube videos for pelvic floor meditation and hip stretches    Person(s) Educated  Patient    Methods  Explanation;Handout    Comprehension  Verbalized understanding       PT Short Term Goals - 06/14/18 1541      PT SHORT TERM GOAL #1   Title  independent with initial HEP    Time  4    Period  Weeks    Status  New    Target Date  07/12/18      PT SHORT TERM GOAL #2   Title  understand correct toileting technique to relax the pelvic floor as she pushes a bowel movement    Time  4    Period  Weeks    Status  New    Target Date  07/12/18      PT SHORT TERM GOAL #3   Title  understand how to perform an abdominal massage to promote perstalic motion of the intestines    Time  4    Period  Weeks    Status  New    Target Date  07/12/18        PT Long Term Goals - 06/14/18 1543      PT LONG TERM GOAL #1   Title  independent with HEP and understand how to progress herself    Time  8    Period  Weeks    Status  New    Target Date  08/09/18      PT LONG TERM GOAL #2   Title  able to perform core exercises without contracting the pelvic floor and pain decreased >/= 70%    Time  8    Period  Weeks    Status  New    Target Date  08/09/18      PT LONG  TERM GOAL #3   Title  ability to have a bowel movement without taking medication due to relaxing the pelvic floor    Time  8    Period  Weeks    Status  New    Target Date  08/09/18      PT LONG TERM GOAL #4   Title  ability to relax the pelvic floor during internal soft tissue work and pain decreased >/= 3/10 so patient is able to have a vaginal exam    Time  8    Period  Weeks    Status  New    Target Date  08/09/18      PT LONG TERM GOAL #5   Title  bending over to pick up an object on the floor with pain decreased >/= 75% due to  decreased trigger points in the abdominal area    Time  8    Period  Weeks    Status  New    Target Date  08/09/18             Plan - 06/14/18 1531    Clinical Impression Statement  Patient is a 57 year old female with chronic pelvic pain and constipation.  Patient has to take medication to have a bowel movement daily and if she does not her bowel movements area every 3 days. Pelvic Floor dysfunction FOTO score is 53%. Patient reports her lower abdominal pain is constant at level 6/10 but will increase when she is bending over, exercise and vaginal exam.  Lumbar extension decreased by 50% and flexion decreased by 25%. Right hip abduction and adduction is 3+/5.  Right ilium is rotated anteriorly.  Palpable tenderness located throughout the abdomen and lumbar paraspinals.  Tenderness located in vulva area and therapist is only able to place her index finger into the introitus 1 cm due to pain.  Perineal body is elevated. Patient reports urinary leakage when she is walking the the bathroom. Patient has to void every  hour.  Patient will benefit from skilled therapy to improve pelvic floor coordination and reduce trigger points to restore function.     History and Personal Factors relevant to plan of care:  full hysterectomy    Clinical Presentation  Stable    Clinical Presentation due to:  only charge TE, Neuro-re -education, manual therapy, biofeedback    Clinical Decision Making  Low    Rehab Potential  Good    Clinical Impairments Affecting Rehab Potential  full hysterectomy    PT Frequency  2x / week may come 1 time per week on none due to vacations    PT Duration  8 weeks    PT Treatment/Interventions  Biofeedback;Therapeutic exercise;Neuromuscular re-education;Manual techniques;Dry needling    PT Next Visit Plan  hip stretches; abominal soft tissue work, toileting, diaphgramatic breathing, bulging of the pelvis floor    Consulted and Agree with Plan of Care  Patient       Patient  will benefit from skilled therapeutic intervention in order to improve the following deficits and impairments:  Increased fascial restricitons, Pain, Decreased mobility, Increased muscle spasms, Impaired tone, Decreased activity tolerance, Decreased endurance, Decreased range of motion, Decreased strength, Hypomobility, Impaired flexibility  Visit Diagnosis: Cramp and spasm - Plan: PT plan of care cert/re-cert  Unspecified lack of coordination - Plan: PT plan of care cert/re-cert  Muscle weakness (generalized) - Plan: PT plan of care cert/re-cert     Problem List There are no active problems to  display for this patient.   Eulis Foster, PT 06/14/18 3:54 PM   [facility] [address] [place], [place], [address] Phone: [phone]   Fax:  [phone]  Name: [name] MRN: [id number] Date of Birth: [birth year]

## 2018-06-16 ENCOUNTER — Encounter: Payer: Self-pay | Admitting: Physical Therapy

## 2018-06-16 ENCOUNTER — Ambulatory Visit: Payer: No Typology Code available for payment source | Admitting: Physical Therapy

## 2018-06-16 DIAGNOSIS — R252 Cramp and spasm: Secondary | ICD-10-CM

## 2018-06-16 DIAGNOSIS — R279 Unspecified lack of coordination: Secondary | ICD-10-CM

## 2018-06-16 DIAGNOSIS — M6281 Muscle weakness (generalized): Secondary | ICD-10-CM

## 2018-06-16 NOTE — Patient Instructions (Signed)
Pelvic Floor Release Stretches (NEW)  [employer] Access Code: [email or web address]  URL: [email or web address]  Date: 06/16/2018  Prepared by: Eulis Fosterheryl Keven Osborn   Exercises  Supine Diaphragmatic Breathing with Pelvic Floor Lengthening - 5 reps - 1 sets - 3 sec hold - 1x daily - 7x weekly  Seated Diaphragmatic Breathing - 5 reps - 1 sets - 3 sec hold - 1x daily - 7x weekly  [facility] [address], [address] [place], [place] [address] Phone # [phone] Fax [phone]

## 2018-06-16 NOTE — Therapy (Addendum)
[facility] [address], [place], [place], [address] Phone: [phone]   Fax:  [phone]  Physical Therapy Treatment  Patient Details  Name: [name] MRN: [id number] Date of Birth: 07-Jul-1961 Referring Provider: Dr. Kalman Shan   Encounter Date: 06/16/2018  PT End of Session - 06/16/18 1022    Visit Number  2    Date for PT Re-Evaluation  08/09/18    Authorization Type  VA needs to use 10 visits by 10/12/2018    Authorization - Visit Number  2    Authorization - Number of Visits  10    PT Start Time  0930    PT Stop Time  1015    PT Time Calculation (min)  45 min    Activity Tolerance  Patient tolerated treatment well    Behavior During Therapy  [facility] for tasks assessed/performed       Past Medical History:  Diagnosis Date  . Anxiety   . Arthritis     Past Surgical History:  Procedure Laterality Date  . ABDOMINAL HYSTERECTOMY  06/20/2009   full  . TUBAL LIGATION      There were no vitals filed for this visit.  Subjective Assessment - 06/16/18 0935    Subjective  I feel dizzy and I did alot of work the last few days.     Patient Stated Goals  decrease pain, use the bathroom without medication every other day, return to exercise for her core with less pain    Currently in Pain?  Yes    Pain Score  6     Pain Location  Abdomen    Pain Orientation  Lower    Pain Descriptors / Indicators  Sharp;Aching    Pain Type  Chronic pain    Pain Onset  More than a month ago    Pain Frequency  Constant    Aggravating Factors   core strength, vaginal exam; bending over    Pain Relieving Factors  alleeve, heat    Multiple Pain Sites  No                       [facility] Adult PT Treatment/Exercise - 06/16/18 0001      Neuro Re-ed    Neuro Re-ed Details   diaphragmatic breathing with hand pressing to engae abdomen      Manual Therapy   Manual Therapy  Soft tissue  mobilization;Myofascial release    Soft tissue mobilization  abdominal tissue, diaphgram, scar massage, suprapubic area    Myofascial Release  to lower abdomen with one hand on sacrum and one on abdomen going through 3 planes of fascia             PT Education - 06/16/18 1012    Education Details  Access Code: [email or web address]     Person(s) Educated  Patient    Methods  Explanation;Demonstration;Verbal cues;Handout    Comprehension  Returned demonstration;Verbalized understanding       PT Short Term Goals - 06/14/18 1541      PT SHORT TERM GOAL #1   Title  independent with initial HEP    Time  4    Period  Weeks    Status  New    Target Date  07/12/18      PT SHORT TERM GOAL #2   Title  understand correct toileting technique to relax the pelvic floor as she pushes a bowel movement  Time  4    Period  Weeks    Status  New    Target Date  07/12/18      PT SHORT TERM GOAL #3   Title  understand how to perform an abdominal massage to promote perstalic motion of the intestines    Time  4    Period  Weeks    Status  New    Target Date  07/12/18        PT Long Term Goals - 06/14/18 1543      PT LONG TERM GOAL #1   Title  independent with HEP and understand how to progress herself    Time  8    Period  Weeks    Status  New    Target Date  08/09/18      PT LONG TERM GOAL #2   Title  able to perform core exercises without contracting the pelvic floor and pain decreased >/= 70%    Time  8    Period  Weeks    Status  New    Target Date  08/09/18      PT LONG TERM GOAL #3   Title  ability to have a bowel movement without taking medication due to relaxing the pelvic floor    Time  8    Period  Weeks    Status  New    Target Date  08/09/18      PT LONG TERM GOAL #4   Title  ability to relax the pelvic floor during internal soft tissue work and pain decreased >/= 3/10 so patient is able to have a vaginal exam    Time  8    Period  Weeks    Status  New    Target  Date  08/09/18      PT LONG TERM GOAL #5   Title  bending over to pick up an object on the floor with pain decreased >/= 75% due to decreased trigger points in the abdominal area    Time  8    Period  Weeks    Status  New    Target Date  08/09/18            Plan - 06/16/18 1012    Clinical Impression Statement  Patient has not had time to do her exercises due to being out of town.  Patient has thickness on the suprapubic area where she has her scar.  Patient was able to perform tactile cues to perform diaphragmatic breathing to stretch her abdomen.  Patient will benefit from skilled therapy to improve pelvic floor coordination and reduce trigger points to restore function.     Rehab Potential  Good    Clinical Impairments Affecting Rehab Potential  full hysterectomy    PT Frequency  2x / week    PT Duration  8 weeks    PT Treatment/Interventions  Biofeedback;Therapeutic exercise;Neuromuscular re-education;Manual techniques;Dry needling    PT Next Visit Plan  hip stretches; abominal soft tissue work, toileting,  bulging of the pelvis floor; correct ilium;     PT Home Exercise Plan  Access Code: [email or web address]     Consulted and Agree with Plan of Care  Patient       Patient will benefit from skilled therapeutic intervention in order to improve the following deficits and impairments:  Increased fascial restricitons, Pain, Decreased mobility, Increased muscle spasms, Impaired tone, Decreased activity tolerance, Decreased endurance, Decreased range of motion, Decreased strength, Hypomobility, Impaired  flexibility  Visit Diagnosis: Cramp and spasm  Unspecified lack of coordination  Muscle weakness (generalized)     Problem List There are no active problems to display for this patient.   Earlie Counts, PT 06/16/18 10:23 AM   [facility] [address] [place], [place], [address] Phone: [phone]   Fax:   [phone]  Name: [name] MRN: [id number] Date of Birth: May 28, 1961  PHYSICAL THERAPY DISCHARGE SUMMARY  Visits from Start of Care: 2  Current functional level related to goals / functional outcomes: See above.    Remaining deficits: See above. Patient has cancelled and no-showed to many appointment. Patient has no rescheduled her last appointment on 07/22/2018 that she cancelled.    Education / Equipment: HEP Plan:                                                    Patient goals were not met. Patient is being discharged due to not returning since the last visit.  Thank you for the referral. Earlie Counts, PT 08/08/18 2:24 PM  ?????

## 2018-06-21 ENCOUNTER — Ambulatory Visit: Payer: No Typology Code available for payment source | Admitting: Physical Therapy

## 2018-06-29 ENCOUNTER — Ambulatory Visit: Payer: No Typology Code available for payment source | Admitting: Physical Therapy

## 2018-06-30 ENCOUNTER — Encounter: Payer: Non-veteran care | Admitting: Physical Therapy

## 2018-07-01 ENCOUNTER — Ambulatory Visit: Payer: No Typology Code available for payment source | Attending: Family | Admitting: Physical Therapy

## 2018-07-01 ENCOUNTER — Telehealth: Payer: Self-pay | Admitting: Physical Therapy

## 2018-07-01 NOTE — Telephone Encounter (Signed)
Called patient about her missing her appointment today at 11:00 AM.   Eulis Fosterheryl [place], PT @7 /11/2018@ 11:21 AM

## 2018-07-18 ENCOUNTER — Encounter: Payer: Non-veteran care | Admitting: Physical Therapy

## 2018-07-22 ENCOUNTER — Ambulatory Visit: Payer: No Typology Code available for payment source | Admitting: Physical Therapy

## 2020-03-04 ENCOUNTER — Ambulatory Visit: Payer: Non-veteran care | Attending: Internal Medicine

## 2020-03-04 DIAGNOSIS — Z23 Encounter for immunization: Secondary | ICD-10-CM

## 2020-03-04 NOTE — Progress Notes (Signed)
   Covid-19 Vaccination Clinic  Name:  [name]    MRN: [id number] DOB: Jan 24, 1961  03/04/2020  [name] was observed post Covid-19 immunization for 15 minutes without incident. She was provided with Vaccine Information Sheet and instruction to access the V-Safe system.   [name] was instructed to call 911 with any severe reactions post vaccine: Marland Kitchen Difficulty breathing  . Swelling of face and throat  . A fast heartbeat  . A bad rash all over body  . Dizziness and weakness   Immunizations Administered    Name Date Dose VIS Date Route   Pfizer COVID-19 Vaccine 03/04/2020  1:22 PM 0.3 mL 12/01/2019 Intramuscular   Manufacturer: [employer], [employer]   Lot: [id number]   NDC: [address]-2

## 2020-03-27 ENCOUNTER — Ambulatory Visit: Payer: Non-veteran care

## 2020-04-03 ENCOUNTER — Ambulatory Visit: Payer: Non-veteran care

## 2020-11-13 ENCOUNTER — Encounter: Payer: Self-pay | Admitting: Orthopaedic Surgery

## 2020-11-13 ENCOUNTER — Ambulatory Visit (INDEPENDENT_AMBULATORY_CARE_PROVIDER_SITE_OTHER): Payer: No Typology Code available for payment source | Admitting: Orthopaedic Surgery

## 2020-11-13 DIAGNOSIS — M7541 Impingement syndrome of right shoulder: Secondary | ICD-10-CM

## 2020-11-13 NOTE — Progress Notes (Signed)
Office Visit Note   Patient: [name]           Date of Birth: 02-Oct-1961           MRN: [id number] Visit Date: 11/13/2020              [facility]ed by: [facility] [address] [place],  [place] [address] PCP: No primary care provider on file.   Assessment & Plan: Visit Diagnoses:  1. Impingement syndrome of right shoulder     Plan: Subacromial injection performed with improvement in her symptoms.  She does not have any adhesive capsulitis and no evidence of rotator cuff tear.  She got improvement with the injection.  We gave her a work note will avoid dance maneuvers with her right arm due to her shoulder problems she has been having.  She has been taking gabapentin and I think she can wean off this.  Follow-Up Instructions: Return in about 2 weeks (around 11/27/2020).   Orders:  Orders Placed This Encounter  Procedures  . Large Joint Inj   No orders of the defined types were placed in this encounter.     Procedures: Large Joint Inj: R subacromial bursa on 11/17/2020 6:42 PM Indications: pain Details: 22 G 1.5 in needle  Arthrogram: No  Medications: 4 mL bupivacaine 0.25 %; 40 mg methylPREDNISolone acetate 40 MG/ML; 0.5 mL lidocaine 1 % Outcome: tolerated well, no immediate complications Procedure, treatment alternatives, risks and benefits explained, specific risks discussed. Consent was given by the patient. Immediately prior to procedure a time out was called to verify the correct patient, procedure, equipment, support staff and site/side marked as required. Patient was prepped and draped in the usual sterile fashion.       Clinical Data: No additional findings.   Subjective: Chief Complaint  Patient presents with  . Right Shoulder - Pain    HPI 59 year old female with right shoulder pain that started in September and is progressing.  She states 2 to 3 years ago she had problems with a frozen shoulder and was unable to raise her arm above  her head for many months.  Last year she had some recurrence in symptoms but did seem to improve.  Now she has good range of motion but still is having pain underneath her shoulder blade in the axilla and radiates into the right lateral breast.  She has had no injection no physical therapy.  She is in school and states after typing she has some increased pain.  She is also taking a class where she does dance maneuvers and has problems with her arm outstretched and overhead.  MRI scan done 10/30/2020 showed no findings of adhesive capsulitis but tendinosis of the intra-articular long head of the biceps tendon.  Patient denies fever chills no associated neck pain.  Review of Systems positive for previous seasonal capsulitis last year all other systems are noncontributory.   Objective: Vital Signs: BP 131/86   Pulse 88   Ht 5\' 4"  (1.626 m)   Wt 140 lb (63.5 kg)   BMI 24.03 kg/m   Physical Exam Constitutional:      Appearance: She is well-developed.  HENT:     Head: Normocephalic.     Right Ear: External ear normal.     Left Ear: External ear normal.  Eyes:     Pupils: Pupils are equal, round, and reactive to light.  Neck:     Thyroid: No thyromegaly.     Trachea: No tracheal  deviation.  Cardiovascular:     Rate and Rhythm: Normal rate.  Pulmonary:     Effort: Pulmonary effort is normal.  Abdominal:     Palpations: Abdomen is soft.  Skin:    General: Skin is warm and dry.  Neurological:     Mental Status: She is alert and oriented to person, place, and time.  Psychiatric:        Behavior: Behavior normal.     Ortho Exam patient is able get her arm up overhead, mildly positive right shoulder impingement minimal discomfort with resisted supraspinatus testing which shows good strength.  Long head of the biceps is stable minimally tender.  No axillary lymphadenopathy.  Reflexes are 2+ and symmetrical.  Specialty Comments:  No specialty comments available.  Imaging: No results  found.   PMFS History: Patient Active Problem List   Diagnosis Date Noted  . Impingement syndrome of right shoulder 11/17/2020   Past Medical History:  Diagnosis Date  . Anxiety   . Arthritis     No family history on file.  Past Surgical History:  Procedure Laterality Date  . ABDOMINAL HYSTERECTOMY  06/20/2009   full  . TUBAL LIGATION     Social History   Occupational History  . Not on file  Tobacco Use  . Smoking status: Never Smoker  . Smokeless tobacco: Never Used  Substance and Sexual Activity  . Alcohol use: Not on file  . Drug use: Not on file  . Sexual activity: Not on file

## 2020-11-17 DIAGNOSIS — M7541 Impingement syndrome of right shoulder: Secondary | ICD-10-CM

## 2020-11-17 MED ORDER — METHYLPREDNISOLONE ACETATE 40 MG/ML IJ SUSP
40.0000 mg | INTRAMUSCULAR | Status: AC | PRN
  Administered 2020-11-17: 40 mg via INTRA_ARTICULAR

## 2020-11-17 MED ORDER — LIDOCAINE HCL 1 % IJ SOLN
0.5000 mL | INTRAMUSCULAR | Status: AC | PRN
  Administered 2020-11-17: .5 mL

## 2020-11-17 MED ORDER — BUPIVACAINE HCL 0.25 % IJ SOLN
4.0000 mL | INTRAMUSCULAR | Status: AC | PRN
  Administered 2020-11-17: 4 mL via INTRA_ARTICULAR

## 2020-11-27 ENCOUNTER — Ambulatory Visit (INDEPENDENT_AMBULATORY_CARE_PROVIDER_SITE_OTHER): Payer: No Typology Code available for payment source | Admitting: Orthopaedic Surgery

## 2020-11-27 ENCOUNTER — Other Ambulatory Visit: Payer: Self-pay

## 2020-11-27 ENCOUNTER — Encounter: Payer: Self-pay | Admitting: Orthopaedic Surgery

## 2020-11-27 VITALS — Ht 64.0 in | Wt 140.0 lb

## 2020-11-27 DIAGNOSIS — M67813 Other specified disorders of tendon, right shoulder: Secondary | ICD-10-CM | POA: Diagnosis not present

## 2020-11-27 DIAGNOSIS — M7541 Impingement syndrome of right shoulder: Secondary | ICD-10-CM

## 2020-11-27 NOTE — Progress Notes (Signed)
Office Visit Note   Patient: [name]           Date of Birth: 1961/01/25           MRN: [id number] Visit Date: 11/27/2020              Requested by: No referring provider defined for this encounter. PCP: No primary care provider on file.   Assessment & Plan: Visit Diagnoses:  1. Impingement syndrome of right shoulder   2. Biceps tendonosis of right shoulder     Plan: She can return after the holidays bring her CD disc so we can review them. We discussed again biceps tendinosis involving the intra-articular portion of long of the biceps tendon. She needs to avoid activities that set tend to be aggravating it such as painting moving outstretch reaching leaf raking etc. Patient has a hard time not doing all activities exactly like she wants to do them. We discussed that if she continues to have ongoing problems that may progress to the point where she will require surgery to get some relief which she would like to avoid. Recheck 5 weeks to bring her disc for review. We again discussed pathophysiology of biceps tendinosis and that with avoidance of activities that tend to bother it and has a good chance of resolving.  Follow-Up Instructions: Return in about 5 weeks (around 01/01/2021).   Orders:  No orders of the defined types were placed in this encounter.  No orders of the defined types were placed in this encounter.     Procedures: No procedures performed   Clinical Data: No additional findings.   Subjective: Chief Complaint  Patient presents with  . Right Shoulder - Pain, Follow-up    HPI 59 year old female returns she still having discomfort in her shoulders states injection helped to some degree she still has full range of motion of her shoulder but has pain whenever she washes dishes or does other activities. She done paining in the summer for the last 2 years recently been trying to box up objects to move to a storage area. She is right-hand dominant. 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 She has some diclofenac gel she has been applying but has been applying it posterior and axillary region where she is having some referred pain not anteriorly over the biceps tendon. She had a CD that she was know to bring but she forgot it and left it in [place].  Review of Systems updated unchanged from previous visit.   Objective: Vital Signs: Ht 5\' 4"  (1.626 m)   Wt 140 lb (63.5 kg)   BMI 24.03 kg/m   Physical Exam Constitutional:      Appearance: She is well-developed.  HENT:     Head: Normocephalic.     Right Ear: External ear normal.     Left Ear: External ear normal.  Eyes:     Pupils: Pupils are equal, round, and reactive to light.  Neck:     Thyroid: No thyromegaly.     Trachea: No tracheal deviation.  Cardiovascular:     Rate and Rhythm: Normal rate.  Pulmonary:     Effort: Pulmonary effort is normal.  Abdominal:     Palpations: Abdomen is soft.  Skin:    General: Skin is warm and dry.  Neurological:     Mental Status: She is alert and oriented to person, place, and time.  Psychiatric:  Behavior: Behavior normal.     Ortho Exam exquisite tenderness along his biceps right shoulder. Positive Hawkins negative Neer no subluxation negative Yergason. Sensation hand is intact. Good cervical range of motion.  Specialty Comments:  No specialty comments available.  Imaging: No results found.   PMFS History: Patient Active Problem List   Diagnosis Date Noted  . Biceps tendonosis of right shoulder 11/27/2020  . Impingement syndrome of right shoulder 11/17/2020   Past Medical History:  Diagnosis Date  . Anxiety   . Arthritis     No family history on file.  Past Surgical History:  Procedure Laterality Date  . ABDOMINAL HYSTERECTOMY  06/20/2009   full  . TUBAL LIGATION     Social History   Occupational History  . Not on file  Tobacco Use  . Smoking status: Never Smoker  .  Smokeless tobacco: Never Used  Substance and Sexual Activity  . Alcohol use: Not on file  . Drug use: Not on file  . Sexual activity: Not on file

## 2020-12-18 ENCOUNTER — Other Ambulatory Visit: Payer: Self-pay

## 2020-12-18 ENCOUNTER — Encounter: Payer: Self-pay | Admitting: Orthopaedic Surgery

## 2020-12-18 ENCOUNTER — Ambulatory Visit (INDEPENDENT_AMBULATORY_CARE_PROVIDER_SITE_OTHER): Payer: No Typology Code available for payment source | Admitting: Orthopaedic Surgery

## 2020-12-18 VITALS — BP 109/79 | HR 78 | Ht 64.0 in | Wt 140.0 lb

## 2020-12-18 DIAGNOSIS — M67813 Other specified disorders of tendon, right shoulder: Secondary | ICD-10-CM

## 2020-12-18 NOTE — Progress Notes (Signed)
Office Visit Note   Patient: [name]           Date of Birth: [birth year]           MRN: [id number] Visit Date: 12/18/2020              Requested by: No referring provider defined for this encounter. PCP: No primary care provider on file.   Assessment & Plan: Visit Diagnoses: No diagnosis found.  Plan: Patient has slight changes intra-articular core portion of the biceps tendon.  At one point she had difficulty in range of motion but this is improved.  She continues to have discomfort in her shoulder bothers her with typing.  Seems to be worse with colder weather.  She is not responded to injection and has been through therapy.  We discussed activity avoidance of things that tend to aggravate her symptoms.  At this point no surgical intervention is recommended.  She can return if she has increasing symptoms.  Follow-Up Instructions: No follow-ups on file.   Orders:  No orders of the defined types were placed in this encounter.  No orders of the defined types were placed in this encounter.     Procedures: No procedures performed   Clinical Data: No additional findings.   Subjective: Chief Complaint  Patient presents with  . Right Shoulder - Pain    HPI 59 year old female returns with right shoulder pain.  She was referred here by the [place].  She has gone to physical therapy in [place].  She noted that taping of the shoulder was helpful.  She is here with her partner.  Patient brought her CD of her shoulder which does show some isolated intermediate changes in the intra-articular segment of the long head of the biceps tendon.  No rotator cuff tearing.  Minimal thickening approximately 2 mm axillary pouch not consistent with adhesive capsulitis.  Patient continues to have some discomfort with overhead activities bothers her when she uses her arm.  She denies numbness or tingling in her hands.  Review of Systems review of systems updated unchanged from early December  office visit 2021 other than as mentioned above.   Objective: Vital Signs: BP 109/79   Pulse 78   Ht 5\' 4"  (1.626 m)   Wt 140 lb (63.5 kg)   BMI 24.03 kg/m   Physical Exam Constitutional:      Appearance: She is well-developed.  HENT:     Head: Normocephalic.     Right Ear: External ear normal.     Left Ear: External ear normal.  Eyes:     Pupils: Pupils are equal, round, and reactive to light.  Neck:     Thyroid: No thyromegaly.     Trachea: No tracheal deviation.  Cardiovascular:     Rate and Rhythm: Normal rate.  Pulmonary:     Effort: Pulmonary effort is normal.  Abdominal:     Palpations: Abdomen is soft.  Skin:    General: Skin is warm and dry.  Neurological:     Mental Status: She is alert and oriented to person, place, and time.  Psychiatric:        Mood and Affect: Mood and affect normal.        Behavior: Behavior normal.     Ortho Exam patient has mild tenderness anteriorly over the biceps tendon.  Negative Yergason.  Negative Hawkins.  Negative drop arm test.  Good cervical range of motion.  No subluxation of the shoulder she can  get our arm up overhead but states she has some discomfort with it. Specialty Comments:  No specialty comments available.  Imaging: No results found.   PMFS History: Patient Active Problem List   Diagnosis Date Noted  . Biceps tendonosis of right shoulder 11/27/2020  . Impingement syndrome of right shoulder 11/17/2020   Past Medical History:  Diagnosis Date  . Anxiety   . Arthritis     No family history on file.  Past Surgical History:  Procedure Laterality Date  . ABDOMINAL HYSTERECTOMY  06/20/2009   full  . TUBAL LIGATION     Social History   Occupational History  . Not on file  Tobacco Use  . Smoking status: Never Smoker  . Smokeless tobacco: Never Used  Substance and Sexual Activity  . Alcohol use: Not on file  . Drug use: Not on file  . Sexual activity: Not on file

## 2022-04-07 ENCOUNTER — Ambulatory Visit: Payer: Non-veteran care

## 2022-04-27 ENCOUNTER — Ambulatory Visit: Payer: No Typology Code available for payment source | Attending: Family

## 2022-04-27 DIAGNOSIS — M542 Cervicalgia: Secondary | ICD-10-CM | POA: Insufficient documentation

## 2022-04-27 DIAGNOSIS — M6281 Muscle weakness (generalized): Secondary | ICD-10-CM

## 2022-04-27 DIAGNOSIS — G8929 Other chronic pain: Secondary | ICD-10-CM | POA: Diagnosis not present

## 2022-04-27 DIAGNOSIS — M25511 Pain in right shoulder: Secondary | ICD-10-CM | POA: Diagnosis present

## 2022-04-27 DIAGNOSIS — M25551 Pain in right hip: Secondary | ICD-10-CM | POA: Diagnosis not present

## 2022-04-27 NOTE — Therapy (Signed)
?OUTPATIENT PHYSICAL THERAPY CERVICAL EVALUATION ? ? ?Patient Name: [name] ?MRN: [id number] ?DOB:[birth year], 61 y.o., female ?Today's Date: 04/27/2022 ? ? PT End of Session - 04/27/22 1459   ? ? Visit Number 1   ? Number of Visits 15   ? Date for PT Re-Evaluation 07/06/22   ? Authorization Type [facility]   ? PT Start Time 1445   ? PT Stop Time 1525   ? PT Time Calculation (min) 40 min   ? Activity Tolerance Patient tolerated treatment well   ? Behavior During Therapy [facility] for tasks assessed/performed   ? ?  ?  ? ?  ? ? ?Past Medical History:  ?Diagnosis Date  ? Anxiety   ? Arthritis   ? ?Past Surgical History:  ?Procedure Laterality Date  ? ABDOMINAL HYSTERECTOMY  06/20/2009  ? full  ? TUBAL LIGATION    ? ?Patient Active Problem List  ? Diagnosis Date Noted  ? Biceps tendonosis of right shoulder 11/27/2020  ? Impingement syndrome of right shoulder 11/17/2020  ? ? ?REFERRING PROVIDER:  ?Guiteau-Laurent, Gertie Gowda, NP ? ?REFERRING DIAG:  ?chronic neck and R shoulder pain ? ?THERAPY DIAG:  ?Cervicalgia - Plan: PT plan of care cert/re-cert ? ?Muscle weakness (generalized) - Plan: PT plan of care cert/re-cert ? ?Chronic right shoulder pain - Plan: PT plan of care cert/re-cert ? ?ONSET DATE: Chronic ? ?SUBJECTIVE:                                                                                                                                                                                                        ? ?SUBJECTIVE STATEMENT: ?Pt presents to PT with reports of chronic neck and R shoulder pain. She notes that symptoms started as frozen shoulder in R UE. Notes that she also has had R sided neck and R upper trap pain and discomfort. Feels she is tense and this exacerbates her pain. Has occasional numbness in R UE, doesn't occur frequently.  ? ?PERTINENT HISTORY:  ?Anxiety ? ?PAIN:  ?Are you having pain? Yes: NPRS scale: 4/10 - 10/10 at worst ?Pain location: R upper trap, R neck, R shoulder ?Aggravating factors:  unsure ?Relieving factors: rest, voltarin  ? ?PRECAUTIONS: None ? ?WEIGHT BEARING RESTRICTIONS No ? ?FALLS:  ?Has patient fallen in last 6 months? No ? ?LIVING ENVIRONMENT: ?Lives with: lives with their family ?Lives in: House/apartment ?Stairs: No difficulty ?Has following equipment at home: None ? ?OCCUPATION: Retired - Investment banker, corporate ? ?PLOF: Independent and Independent with basic ADLs ? ?PATIENT GOALS: ? ?OBJECTIVE:  ? ?DIAGNOSTIC FINDINGS:  ?  N/A ? ?PATIENT SURVEYS:  ?FOTO Shoulder: 57% function; 65% predicted  ?FOTO Neck: 48% function; 58% predicted ? ?COGNITION: ?Overall cognitive status: Within functional limits for tasks assessed ? ? ?SENSATION: ?WFL ? ?POSTURE:  ?Rounded shoulders, fwd head ? ?PALPATION: ?TTP to R upper trap, R cervical paraspinals  ? ?CERVICAL ROM:  ? ?Active ROM A/PROM (deg) ?04/27/2022  ?Right rotation 40  ?Left rotation 55  ? (Blank rows = not tested) ? ?UE ROM: ? ?AROM Right ?04/27/2022 Left ?04/27/2022  ?Shoulder flexion [facility] WFL  ?Shoulder extension    ?Shoulder abduction [facility] WFL  ?Shoulder internal rotation    ?Shoulder external rotation    ?Elbow flexion    ?Elbow extension    ?Wrist flexion    ?Wrist extension    ?(Blank rows = not tested) ? ? ?UE MMT: ?  ?MMT Right ?04/27/22   Left ? 04/27/22   ?Shoulder flexion 4/5 4+/5  ?Shoulder abduction 4/5 4+/5  ?Shoulder ER 4/5 4+/5  ?Shoulder IR 4/5 4+/5  ?Middle trapezius    ?Lower trapezius    ?Shoulder extension    ?Grip strength    ?Cervical flexion (C1,C2)    ?Cervical S/B (C3)    ?Shoulder shrug (C4)    ?Elbow flexion (C6)    ?Elbow ext (C7)    ?Thumb ext (C8)    ?Finger abd (T1)    ?(Blank rows = not tested) ? ? ?FUNCTIONAL TESTS:  ?Cervical Flexion Endurance Test: 9 seconds ? ?TODAY'S TREATMENT:  ?[facility] Adult PT Treatment:                                                DATE: 04/27/2022 ?Therapeutic Exercise: ?Cervical SNAG x 5 R  ?R upper trap stretch x 30" ?Supine chin tuck x 10  ?Row BTB x 10 ?Manual Therapy: ?Positional release to R  upper trap ? ?PATIENT EDUCATION:  ?Education details: eval findings, FOTO, HEP, POC ?Person educated: Patient ?Education method: Explanation, Demonstration, and Handouts ?Education comprehension: verbalized understanding and returned demonstration ? ?HOME EXERCISE PROGRAM: ?Access Code: [email or web address] ?URL: [email or web address] ?Date: 04/27/2022 ?Prepared by: Edwinna Areola ? ?Exercises ?- Seated Assisted Cervical Rotation with Towel  - 1 x daily - 7 x weekly - 2 sets - 10 reps - 5 sec hold ?- Supine Chin Tuck  - 1 x daily - 7 x weekly - 2 sets - 10 reps - 5 sec hold ?- Seated Upper Trapezius Stretch  - 2 x daily - 7 x weekly - 2 reps - 30 sec hold ?- Standing Shoulder Row with Anchored Resistance  - 1 x daily - 7 x weekly - 3 sets - 10 reps ? ?ASSESSMENT: ? ?CLINICAL IMPRESSION: ?Patient is a 61 y.o. F who was seen today for physical therapy evaluation and treatment for chronic neck and R shoulder pain. Physical findings are consistent with referring provider impression, as pt demonstrates palpable pain in R upper trap, decreased cervical ROM, and decreased DNF strength/endurance. Her FOTO score indicates she is operating below PLOF and would benefit from skilled PT services in order to decrease pain and improve function. Will assess response to initial HEP and progress as able.   ? ? ?OBJECTIVE IMPAIRMENTS decreased ROM, decreased strength, increased muscle spasms, impaired flexibility, and pain.  ? ?ACTIVITY LIMITATIONS community activity, driving, occupation, and yard work.  ? ?PERSONAL FACTORS Time since onset of  injury/illness/exacerbation are also affecting patient's functional outcome.  ? ? ?REHAB POTENTIAL: Excellent ? ?CLINICAL DECISION MAKING: Stable/uncomplicated ? ?EVALUATION COMPLEXITY: Low ? ? ?GOALS: ?Goals reviewed with patient? No ? ?SHORT TERM GOALS: Target date: 05/18/2022 ? ?Pt will be compliant and knowledgeable with initial HEP for improved comfort and carryover ?Baseline: initial HEP  given  ?Goal status: INITIAL ? ?2.  Pt will self report neck and R shoulder pain no greater than 6/10 for improved comfort and functional ability ?Baseline: 10/10 at worst ?Goal status: INITIAL ? ?LONG TERM GOALS: Target date: 06/22/2022 ? ?Pt will self report neck and R shoulder pain no greater than 3/10 for improved comfort and functional ability ?Baseline: 10/10 at worst ?Goal status: INITIAL ? ?2.  Pt will improve shoulder FOTO function score to no less than 65% as proxy for functional improvement ?Baseline: 57% function ?Goal status: INITIAL ? ?3.  Pt will improve neck FOTO function score to no less than 58% as proxy for functional improvement ?Baseline: 48% function ?Goal status: INITIAL ? ?4.  Pt will increase R cervical rotation to no less than 55 deg for improved functional ability with driving ?Baseline: 40 deg ?Goal status: INITIAL ? ?5.  Pt will improve Cervical Flexion Endurance Test to no less than 20 seconds hold time in order to improve postural endurance and decrease pain ?Baseline: 9 seconds ?Goal status: INITIAL ? ?PLAN: ?PT FREQUENCY: 1-2x/week ? ?PT DURATION: 10 weeks ? ?PLANNED INTERVENTIONS: Therapeutic exercises, Therapeutic activity, Neuromuscular re-education, Balance training, Gait training, Patient/Family education, Joint mobilization, Dry Needling, Electrical stimulation, Cryotherapy, Moist heat, and Manual therapy ? ?PLAN FOR NEXT SESSION: assess HEP response, DNF and periscapular strengthening, manual to R upper trap and neck ? ? ?Eloy Endavid C Lizett Chowning, PT ?04/27/2022, 4:51 PM ? ? ? ? ? ?

## 2022-05-12 ENCOUNTER — Ambulatory Visit: Payer: No Typology Code available for payment source

## 2022-05-19 ENCOUNTER — Ambulatory Visit: Payer: No Typology Code available for payment source

## 2022-05-19 NOTE — Therapy (Incomplete)
OUTPATIENT PHYSICAL THERAPY TREATMENT NOTE   Patient Name: [name] MRN: [id number] DOB:1961-10-17, 61 y.o., female Today's Date: 05/19/2022  REFERRING PROVIDER: Ned Clines, NP  END OF SESSION:    Past Medical History:  Diagnosis Date   Anxiety    Arthritis    Past Surgical History:  Procedure Laterality Date   ABDOMINAL HYSTERECTOMY  06/20/2009   full   TUBAL LIGATION     Patient Active Problem List   Diagnosis Date Noted   Biceps tendonosis of right shoulder 11/27/2020   Impingement syndrome of right shoulder 11/17/2020    REFERRING DIAG: chronic neck and R shoulder pain  THERAPY DIAG:  No diagnosis found.  Rationale for Evaluation and Treatment Rehabilitation  PERTINENT HISTORY:  Anxiety  PRECAUTIONS:  None  SUBJECTIVE:  ***  PAIN:  Are you having pain? Yes: NPRS scale: 4/10 - 10/10 at worst Pain location: R upper trap, R neck, R shoulder Aggravating factors: unsure Relieving factors: rest, voltarin    OBJECTIVE: (objective measures completed at initial evaluation unless otherwise dated)  DIAGNOSTIC FINDINGS:  N/A   PATIENT SURVEYS:  FOTO Shoulder: 57% function; 65% predicted  FOTO Neck: 48% function; 58% predicted   COGNITION: Overall cognitive status: Within functional limits for tasks assessed     SENSATION: WFL   POSTURE:  Rounded shoulders, fwd head   PALPATION: TTP to R upper trap, R cervical paraspinals          CERVICAL ROM:    Active ROM A/PROM (deg) 04/27/2022  Right rotation 40  Left rotation 55   (Blank rows = not tested)   UE ROM:   AROM Right 04/27/2022 Left 04/27/2022  Shoulder flexion [facility]  Shoulder extension      Shoulder abduction [facility]  Shoulder internal rotation      Shoulder external rotation      Elbow flexion      Elbow extension      Wrist flexion      Wrist extension      (Blank rows = not tested)     UE MMT:           MMT Right 04/27/22   Left  04/27/22    Shoulder flexion 4/5 4+/5  Shoulder abduction 4/5 4+/5  Shoulder ER 4/5 4+/5  Shoulder IR 4/5 4+/5  Middle trapezius      Lower trapezius      Shoulder extension      Grip strength      Cervical flexion (C1,C2)      Cervical S/B (C3)      Shoulder shrug (C4)      Elbow flexion (C6)      Elbow ext (C7)      Thumb ext (C8)      Finger abd (T1)      (Blank rows = not tested)     FUNCTIONAL TESTS:  Cervical Flexion Endurance Test: 9 seconds   TODAY'S TREATMENT:  [facility] Adult PT Treatment:                                                DATE: 04/27/2022 Therapeutic Exercise: Cervical SNAG x 5 R  R upper trap stretch x 30" Supine chin tuck x 10  Row BTB x 10 Manual Therapy: Positional release to R upper trap   PATIENT EDUCATION:  Education details:  eval findings, FOTO, HEP, POC Person educated: Patient Education method: Explanation, Demonstration, and Handouts Education comprehension: verbalized understanding and returned demonstration   HOME EXERCISE PROGRAM: Access Code: [email or web address] URL: [email or web address] Date: 04/27/2022 Prepared by: Edwinna Areola   Exercises - Seated Assisted Cervical Rotation with Towel  - 1 x daily - 7 x weekly - 2 sets - 10 reps - 5 sec hold - Supine Chin Tuck  - 1 x daily - 7 x weekly - 2 sets - 10 reps - 5 sec hold - Seated Upper Trapezius Stretch  - 2 x daily - 7 x weekly - 2 reps - 30 sec hold - Standing Shoulder Row with Anchored Resistance  - 1 x daily - 7 x weekly - 3 sets - 10 reps   ASSESSMENT:   CLINICAL IMPRESSION: ***      OBJECTIVE IMPAIRMENTS decreased ROM, decreased strength, increased muscle spasms, impaired flexibility, and pain.    ACTIVITY LIMITATIONS community activity, driving, occupation, and yard work.    PERSONAL FACTORS Time since onset of injury/illness/exacerbation are also affecting patient's functional outcome.      REHAB POTENTIAL: Excellent   CLINICAL DECISION MAKING: Stable/uncomplicated    EVALUATION COMPLEXITY: Low     GOALS: Goals reviewed with patient? No   SHORT TERM GOALS: Target date: 05/18/2022   Pt will be compliant and knowledgeable with initial HEP for improved comfort and carryover Baseline: initial HEP given  Goal status: INITIAL   2.  Pt will self report neck and R shoulder pain no greater than 6/10 for improved comfort and functional ability Baseline: 10/10 at worst Goal status: INITIAL   LONG TERM GOALS: Target date: 06/22/2022   Pt will self report neck and R shoulder pain no greater than 3/10 for improved comfort and functional ability Baseline: 10/10 at worst Goal status: INITIAL   2.  Pt will improve shoulder FOTO function score to no less than 65% as proxy for functional improvement Baseline: 57% function Goal status: INITIAL   3.  Pt will improve neck FOTO function score to no less than 58% as proxy for functional improvement Baseline: 48% function Goal status: INITIAL   4.  Pt will increase R cervical rotation to no less than 55 deg for improved functional ability with driving Baseline: 40 deg Goal status: INITIAL   5.  Pt will improve Cervical Flexion Endurance Test to no less than 20 seconds hold time in order to improve postural endurance and decrease pain Baseline: 9 seconds Goal status: INITIAL   PLAN: PT FREQUENCY: 1-2x/week   PT DURATION: 10 weeks   PLANNED INTERVENTIONS: Therapeutic exercises, Therapeutic activity, Neuromuscular re-education, Balance training, Gait training, Patient/Family education, Joint mobilization, Dry Needling, Electrical stimulation, Cryotherapy, Moist heat, and Manual therapy   PLAN FOR NEXT SESSION: assess HEP response, DNF and periscapular strengthening, manual to R upper trap and neck    Eloy End, PT 05/19/2022, 9:05 AM

## 2022-05-28 ENCOUNTER — Ambulatory Visit: Payer: No Typology Code available for payment source | Attending: Family

## 2022-05-28 DIAGNOSIS — M542 Cervicalgia: Secondary | ICD-10-CM | POA: Diagnosis present

## 2022-05-28 DIAGNOSIS — M6281 Muscle weakness (generalized): Secondary | ICD-10-CM | POA: Diagnosis present

## 2022-05-28 DIAGNOSIS — G8929 Other chronic pain: Secondary | ICD-10-CM | POA: Insufficient documentation

## 2022-05-28 DIAGNOSIS — M25511 Pain in right shoulder: Secondary | ICD-10-CM | POA: Insufficient documentation

## 2022-05-28 NOTE — Therapy (Signed)
OUTPATIENT PHYSICAL THERAPY TREATMENT NOTE   Patient Name: [name] MRN: [id number] DOB:06/10/1961, 61 y.o., female Today's Date: 05/28/2022  REFERRING PROVIDER: Ned Clines, NP  END OF SESSION:   PT End of Session - 05/28/22 1000     Visit Number 2    Number of Visits 15    Date for PT Re-Evaluation 07/06/22    Authorization Type [place]    PT Start Time 1000    PT Stop Time 1040    PT Time Calculation (min) 40 min    Activity Tolerance Patient tolerated treatment well    Behavior During Therapy WFL for tasks assessed/performed             Past Medical History:  Diagnosis Date   Anxiety    Arthritis    Past Surgical History:  Procedure Laterality Date   ABDOMINAL HYSTERECTOMY  06/20/2009   full   TUBAL LIGATION     Patient Active Problem List   Diagnosis Date Noted   Biceps tendonosis of right shoulder 11/27/2020   Impingement syndrome of right shoulder 11/17/2020    REFERRING DIAG: chronic neck and R shoulder pain  THERAPY DIAG:  Cervicalgia  Muscle weakness (generalized)  Chronic right shoulder pain  Rationale for Evaluation and Treatment Rehabilitation  PERTINENT HISTORY:  Anxiety  PRECAUTIONS:  None  SUBJECTIVE:  Pt presents to PT with no reports of neck or R shoulder pain. She has been compliant with HEP with no adverse effect. Pt is ready to begin PT at this time.  PAIN:  Are you having pain? Yes: NPRS scale: 4/10 - 10/10 at worst Pain location: R upper trap, R neck, R shoulder Aggravating factors: unsure Relieving factors: rest, voltarin    OBJECTIVE: (objective measures completed at initial evaluation unless otherwise dated)  DIAGNOSTIC FINDINGS:  N/A   PATIENT SURVEYS:  FOTO Shoulder: 57% function; 65% predicted  FOTO Neck: 48% function; 58% predicted   COGNITION: Overall cognitive status: Within functional limits for tasks assessed     SENSATION: WFL   POSTURE:  Rounded shoulders, fwd head    PALPATION: TTP to R upper trap, R cervical paraspinals          CERVICAL ROM:    Active ROM A/PROM (deg) 04/27/2022  Right rotation 40  Left rotation 55   (Blank rows = not tested)   UE ROM:   AROM Right 04/27/2022 Left 04/27/2022  Shoulder flexion [facility]  Shoulder extension      Shoulder abduction [facility]  Shoulder internal rotation      Shoulder external rotation      Elbow flexion      Elbow extension      Wrist flexion      Wrist extension      (Blank rows = not tested)     UE MMT:           MMT Right 04/27/22   Left  04/27/22   Shoulder flexion 4/5 4+/5  Shoulder abduction 4/5 4+/5  Shoulder ER 4/5 4+/5  Shoulder IR 4/5 4+/5  Middle trapezius      Lower trapezius      Shoulder extension      Grip strength      Cervical flexion (C1,C2)      Cervical S/B (C3)      Shoulder shrug (C4)      Elbow flexion (C6)      Elbow ext (C7)      Thumb ext (C8)  Finger abd (T1)      (Blank rows = not tested)     FUNCTIONAL TESTS:  Cervical Flexion Endurance Test: 9 seconds   TODAY'S TREATMENT:  [facility] Adult PT Treatment:                                                DATE: 05/28/2022 Therapeutic Exercise: UBE lvl 2.0 x 4 min while taking subjective (48fwd/2bwd) Row 2x10 20# Shoulder ext 2x10 20# Shoulder ER 2x10 3# Seated horizontal abd 2x10 RTB Supine chin tuck 2x10 - 5" hold Supine D2 flexion 2x10 RTB each Manual Therapy: Positional release to R upper trap Suboccipital release  [facility] Adult PT Treatment:                                                DATE: 04/27/2022 Therapeutic Exercise: Cervical SNAG x 5 R  R upper trap stretch x 30" Supine chin tuck x 10  Row BTB x 10 Manual Therapy: Positional release to R upper trap   PATIENT EDUCATION:  Education details: eval findings, FOTO, HEP, POC Person educated: Patient Education method: Explanation, Demonstration, and Handouts Education comprehension: verbalized understanding and returned demonstration    HOME EXERCISE PROGRAM: Access Code: [email or web address] URL: [email or web address] Date: 04/27/2022 Prepared by: Edwinna Areola   Exercises - Seated Assisted Cervical Rotation with Towel  - 1 x daily - 7 x weekly - 2 sets - 10 reps - 5 sec hold - Supine Chin Tuck  - 1 x daily - 7 x weekly - 2 sets - 10 reps - 5 sec hold - Seated Upper Trapezius Stretch  - 2 x daily - 7 x weekly - 2 reps - 30 sec hold - Standing Shoulder Row with Anchored Resistance  - 1 x daily - 7 x weekly - 3 sets - 10 reps   ASSESSMENT:   CLINICAL IMPRESSION: Pt was able to complete all prescribed exercises with no adverse effect or increase in pain. Therapy today focused on improving periscapular and RTC strength to decrease neck and shoulder pain. She continues to benefit from skilled PT services and will continue to be seen and progressed as able per POC.      OBJECTIVE IMPAIRMENTS decreased ROM, decreased strength, increased muscle spasms, impaired flexibility, and pain.    ACTIVITY LIMITATIONS community activity, driving, occupation, and yard work.    PERSONAL FACTORS Time since onset of injury/illness/exacerbation are also affecting patient's functional outcome.     GOALS: Goals reviewed with patient? No   SHORT TERM GOALS: Target date: 05/18/2022   Pt will be compliant and knowledgeable with initial HEP for improved comfort and carryover Baseline: initial HEP given  Goal status: INITIAL   2.  Pt will self report neck and R shoulder pain no greater than 6/10 for improved comfort and functional ability Baseline: 10/10 at worst Goal status: INITIAL   LONG TERM GOALS: Target date: 06/22/2022   Pt will self report neck and R shoulder pain no greater than 3/10 for improved comfort and functional ability Baseline: 10/10 at worst Goal status: INITIAL   2.  Pt will improve shoulder FOTO function score to no less than 65% as proxy for functional improvement Baseline: 57% function Goal status: INITIAL  3.  Pt will improve neck FOTO function score to no less than 58% as proxy for functional improvement Baseline: 48% function Goal status: INITIAL   4.  Pt will increase R cervical rotation to no less than 55 deg for improved functional ability with driving Baseline: 40 deg Goal status: INITIAL   5.  Pt will improve Cervical Flexion Endurance Test to no less than 20 seconds hold time in order to improve postural endurance and decrease pain Baseline: 9 seconds Goal status: INITIAL   PLAN: PT FREQUENCY: 1-2x/week   PT DURATION: 10 weeks   PLANNED INTERVENTIONS: Therapeutic exercises, Therapeutic activity, Neuromuscular re-education, Balance training, Gait training, Patient/Family education, Joint mobilization, Dry Needling, Electrical stimulation, Cryotherapy, Moist heat, and Manual therapy   PLAN FOR NEXT SESSION: assess HEP response, DNF and periscapular strengthening, manual to R upper trap and neck    Eloy Endavid C Diahann Guajardo, PT 05/28/2022, 10:43 AM

## 2022-06-02 ENCOUNTER — Ambulatory Visit: Payer: No Typology Code available for payment source

## 2022-06-03 NOTE — Therapy (Signed)
OUTPATIENT PHYSICAL THERAPY TREATMENT NOTE   Patient Name: [name] MRN: [id number] DOB:07/11/1961, 61 y.o., female Today's Date: 06/04/2022  REFERRING PROVIDER: Ned Clines, NP  END OF SESSION:   PT End of Session - 06/04/22 1343     Visit Number 3    Number of Visits 15    Date for PT Re-Evaluation 07/06/22    Authorization Type [facility]    PT Start Time 1330    PT Stop Time 1355    PT Time Calculation (min) 25 min    Activity Tolerance Patient tolerated treatment well    Behavior During Therapy WFL for tasks assessed/performed              Past Medical History:  Diagnosis Date   Anxiety    Arthritis    Past Surgical History:  Procedure Laterality Date   ABDOMINAL HYSTERECTOMY  06/20/2009   full   TUBAL LIGATION     Patient Active Problem List   Diagnosis Date Noted   Biceps tendonosis of right shoulder 11/27/2020   Impingement syndrome of right shoulder 11/17/2020    REFERRING DIAG: chronic neck and R shoulder pain  THERAPY DIAG:  Cervicalgia  Muscle weakness (generalized)  Chronic right shoulder pain  Rationale for Evaluation and Treatment Rehabilitation  PERTINENT HISTORY:  Anxiety  PRECAUTIONS:  None  SUBJECTIVE: Reports a little pain to neck and shoulder region, more focused on L ankle pain, insidious in onset.   PAIN:  Are you having pain? Yes: NPRS scale: 4/10 - 10/10 at worst Pain location: R upper trap, R neck, R shoulder Aggravating factors: unsure Relieving factors: rest, voltarin    OBJECTIVE: (objective measures completed at initial evaluation unless otherwise dated)  DIAGNOSTIC FINDINGS:  N/A   PATIENT SURVEYS:  FOTO Shoulder: 57% function; 65% predicted  FOTO Neck: 48% function; 58% predicted   COGNITION: Overall cognitive status: Within functional limits for tasks assessed     SENSATION: WFL   POSTURE:  Rounded shoulders, fwd head   PALPATION: TTP to R upper trap, R cervical paraspinals           CERVICAL ROM:    Active ROM A/PROM (deg) 04/27/2022  Right rotation 40  Left rotation 55   (Blank rows = not tested)   UE ROM:   AROM Right 04/27/2022 Left 04/27/2022  Shoulder flexion [facility]  Shoulder extension      Shoulder abduction [facility]  Shoulder internal rotation      Shoulder external rotation      Elbow flexion      Elbow extension      Wrist flexion      Wrist extension      (Blank rows = not tested)     UE MMT:           MMT Right 04/27/22   Left  04/27/22   Shoulder flexion 4/5 4+/5  Shoulder abduction 4/5 4+/5  Shoulder ER 4/5 4+/5  Shoulder IR 4/5 4+/5  Middle trapezius      Lower trapezius      Shoulder extension      Grip strength      Cervical flexion (C1,C2)      Cervical S/B (C3)      Shoulder shrug (C4)      Elbow flexion (C6)      Elbow ext (C7)      Thumb ext (C8)      Finger abd (T1)      (Blank rows =  not tested)     FUNCTIONAL TESTS:  Cervical Flexion Endurance Test: 9 seconds   TODAY'S TREATMENT:  [facility] Adult PT Treatment:                                                DATE: 06/04/22 Therapeutic Exercise: UBE L1.0 2/2 Supine press 2# 15x Supine flexion 2# 15x Sidelie R ER 2# 12x Sidelie abduction 2# 7x Manual Therapy: STM to R pec minor and levator    [facility] Adult PT Treatment:                                                DATE: 05/28/2022 Therapeutic Exercise: UBE lvl 2.0 x 4 min while taking subjective (462fwd/2bwd) Row 2x10 20# Shoulder ext 2x10 20# Shoulder ER 2x10 3# Seated horizontal abd 2x10 RTB Supine chin tuck 2x10 - 5" hold Supine D2 flexion 2x10 RTB each Manual Therapy: Positional release to R upper trap Suboccipital release  [facility] Adult PT Treatment:                                                DATE: 04/27/2022 Therapeutic Exercise: Cervical SNAG x 5 R  R upper trap stretch x 30" Supine chin tuck x 10  Row BTB x 10 Manual Therapy: Positional release to R upper trap   PATIENT EDUCATION:  Education  details: eval findings, FOTO, HEP, POC Person educated: Patient Education method: Explanation, Demonstration, and Handouts Education comprehension: verbalized understanding and returned demonstration   HOME EXERCISE PROGRAM: Access Code: [email or web address] URL: [email or web address] Date: 04/27/2022 Prepared by: Edwinna Areolaavid Stroup   Exercises - Seated Assisted Cervical Rotation with Towel  - 1 x daily - 7 x weekly - 2 sets - 10 reps - 5 sec hold - Supine Chin Tuck  - 1 x daily - 7 x weekly - 2 sets - 10 reps - 5 sec hold - Seated Upper Trapezius Stretch  - 2 x daily - 7 x weekly - 2 reps - 30 sec hold - Standing Shoulder Row with Anchored Resistance  - 1 x daily - 7 x weekly - 3 sets - 10 reps   ASSESSMENT:   CLINICAL IMPRESSION: (15 min late).  Minimal neck and shoulder pain noted at start of session.  Treatment scope limited by time constraints.  Focus on R shoulder strengthening, ROM and functional training.  Limited tolerance to exercise due to worsening R shoulder pain.    OBJECTIVE IMPAIRMENTS decreased ROM, decreased strength, increased muscle spasms, impaired flexibility, and pain.    ACTIVITY LIMITATIONS community activity, driving, occupation, and yard work.    PERSONAL FACTORS Time since onset of injury/illness/exacerbation are also affecting patient's functional outcome.     GOALS: Goals reviewed with patient? No   SHORT TERM GOALS: Target date: 05/18/2022   Pt will be compliant and knowledgeable with initial HEP for improved comfort and carryover Baseline: initial HEP given  Goal status: INITIAL   2.  Pt will self report neck and R shoulder pain no greater than 6/10 for improved comfort and functional ability Baseline: 10/10 at worst Goal  status: INITIAL   LONG TERM GOALS: Target date: 06/22/2022   Pt will self report neck and R shoulder pain no greater than 3/10 for improved comfort and functional ability Baseline: 10/10 at worst Goal status: INITIAL   2.  Pt  will improve shoulder FOTO function score to no less than 65% as proxy for functional improvement Baseline: 57% function Goal status: INITIAL   3.  Pt will improve neck FOTO function score to no less than 58% as proxy for functional improvement Baseline: 48% function Goal status: INITIAL   4.  Pt will increase R cervical rotation to no less than 55 deg for improved functional ability with driving Baseline: 40 deg Goal status: INITIAL   5.  Pt will improve Cervical Flexion Endurance Test to no less than 20 seconds hold time in order to improve postural endurance and decrease pain Baseline: 9 seconds Goal status: INITIAL   PLAN: PT FREQUENCY: 1-2x/week   PT DURATION: 10 weeks   PLANNED INTERVENTIONS: Therapeutic exercises, Therapeutic activity, Neuromuscular re-education, Balance training, Gait training, Patient/Family education, Joint mobilization, Dry Needling, Electrical stimulation, Cryotherapy, Moist heat, and Manual therapy   PLAN FOR NEXT SESSION: assess HEP response, DNF and periscapular strengthening, manual to R upper trap and neck    Hildred Laser, PT 06/04/2022, 2:04 PM

## 2022-06-04 ENCOUNTER — Ambulatory Visit: Payer: No Typology Code available for payment source

## 2022-06-04 DIAGNOSIS — M542 Cervicalgia: Secondary | ICD-10-CM | POA: Diagnosis not present

## 2022-06-04 DIAGNOSIS — M6281 Muscle weakness (generalized): Secondary | ICD-10-CM

## 2022-06-04 DIAGNOSIS — G8929 Other chronic pain: Secondary | ICD-10-CM

## 2022-06-08 ENCOUNTER — Ambulatory Visit: Payer: No Typology Code available for payment source

## 2022-06-08 DIAGNOSIS — M6281 Muscle weakness (generalized): Secondary | ICD-10-CM

## 2022-06-08 DIAGNOSIS — M542 Cervicalgia: Secondary | ICD-10-CM | POA: Diagnosis not present

## 2022-06-08 DIAGNOSIS — G8929 Other chronic pain: Secondary | ICD-10-CM

## 2022-06-08 NOTE — Therapy (Signed)
OUTPATIENT PHYSICAL THERAPY TREATMENT NOTE   Patient Name: [name] MRN: [id number] DOB:July 27, 1961, 61 y.o., female Today's Date: 06/08/2022  REFERRING PROVIDER: Elba Barman, NP  END OF SESSION:   PT End of Session - 06/08/22 1529     Visit Number 4    Number of Visits 15    Date for PT Re-Evaluation 07/06/22    Authorization Type [facility]    PT Start Time 1530    PT Stop Time 1610    PT Time Calculation (min) 40 min    Activity Tolerance Patient tolerated treatment well    Behavior During Therapy WFL for tasks assessed/performed               Past Medical History:  Diagnosis Date   Anxiety    Arthritis    Past Surgical History:  Procedure Laterality Date   ABDOMINAL HYSTERECTOMY  06/20/2009   full   TUBAL LIGATION     Patient Active Problem List   Diagnosis Date Noted   Biceps tendonosis of right shoulder 11/27/2020   Impingement syndrome of right shoulder 11/17/2020    REFERRING DIAG: chronic neck and R shoulder pain  THERAPY DIAG:  Cervicalgia  Muscle weakness (generalized)  Chronic right shoulder pain  Rationale for Evaluation and Treatment Rehabilitation  PERTINENT HISTORY:  Anxiety  PRECAUTIONS:  None  SUBJECTIVE:  Pt presents to PT with reports of decreased pain and discomfort. Has been compliant with HEP with no adverse effect. Pt is ready to begin PT at this time.  PAIN:  Are you having pain?  Yes: NPRS scale: 4/10 - 10/10 at worst Pain location: R upper trap, R neck, R shoulder Aggravating factors: unsure Relieving factors: rest, voltarin    OBJECTIVE: (objective measures completed at initial evaluation unless otherwise dated)  DIAGNOSTIC FINDINGS:  N/A   PATIENT SURVEYS:  FOTO Shoulder: 57% function; 65% predicted  FOTO Neck: 48% function; 58% predicted   COGNITION: Overall cognitive status: Within functional limits for tasks assessed     SENSATION: WFL   POSTURE:  Rounded shoulders, fwd head    PALPATION: TTP to R upper trap, R cervical paraspinals          CERVICAL ROM:    Active ROM A/PROM (deg) 04/27/2022  Right rotation 40  Left rotation 55   (Blank rows = not tested)   UE ROM:   AROM Right 04/27/2022 Left 04/27/2022  Shoulder flexion [facility]  Shoulder extension      Shoulder abduction [facility]  Shoulder internal rotation      Shoulder external rotation      Elbow flexion      Elbow extension      Wrist flexion      Wrist extension      (Blank rows = not tested)     UE MMT:           MMT Right 04/27/22   Left  04/27/22   Shoulder flexion 4/5 4+/5  Shoulder abduction 4/5 4+/5  Shoulder ER 4/5 4+/5  Shoulder IR 4/5 4+/5  Middle trapezius      Lower trapezius      Shoulder extension      Grip strength      Cervical flexion (C1,C2)      Cervical S/B (C3)      Shoulder shrug (C4)      Elbow flexion (C6)      Elbow ext (C7)      Thumb ext (C8)  Finger abd (T1)      (Blank rows = not tested)     FUNCTIONAL TESTS:  Cervical Flexion Endurance Test: 9 seconds   TODAY'S TREATMENT:  [facility] Adult PT Treatment:                                                DATE: 06/08/2022 Therapeutic Exercise: UBE lvl 2.0 x 4 min while taking subjective (5fd/2bwd) Row 3x12 20# Shoulder ext 2x10 23# Shoulder ER x 10 3# each Shoulder IR x 10 7# each Seated horizontal abd x 15 GTB Seated bilateral ER 2x10 GTB Supine chin tuck 2x10 - 5" hold Cat Cow x 10 S/L open book x 10 each Manual Therapy: Positional release to R upper trap Suboccipital release Prone Grade III PA C7-T4 Prone thoracic grade V thrust manipulation  [facility] Adult PT Treatment:                                                DATE: 06/04/2022 Therapeutic Exercise: UBE L1.0 2/2 Supine press 2# 15x Supine flexion 2# 15x Sidelie R ER 2# 12x Sidelie abduction 2# 7x Manual Therapy: STM to R pec minor and levator   [facility] Adult PT Treatment:                                                DATE:  05/28/2022 Therapeutic Exercise: UBE lvl 2.0 x 4 min while taking subjective (25f/2bwd) Row 2x10 20# Shoulder ext 2x10 20# Shoulder ER 2x10 3# Seated horizontal abd 2x10 RTB Supine chin tuck 2x10 - 5" hold Supine D2 flexion 2x10 RTB each Manual Therapy: Positional release to R upper trap Suboccipital release   PATIENT EDUCATION:  Education details: eval findings, FOTO, HEP, POC Person educated: Patient Education method: Explanation, Demonstration, and Handouts Education comprehension: verbalized understanding and returned demonstration   HOME EXERCISE PROGRAM: Access Code: [email or web address] URL: [email or web address] Date: 04/27/2022 Prepared by: DaOctavio Manns Exercises - Seated Assisted Cervical Rotation with Towel  - 1 x daily - 7 x weekly - 2 sets - 10 reps - 5 sec hold - Supine Chin Tuck  - 1 x daily - 7 x weekly - 2 sets - 10 reps - 5 sec hold - Seated Upper Trapezius Stretch  - 2 x daily - 7 x weekly - 2 reps - 30 sec hold - Standing Shoulder Row with Anchored Resistance  - 1 x daily - 7 x weekly - 3 sets - 10 reps   ASSESSMENT:   CLINICAL IMPRESSION:  Pt was able to complete all prescribed exercises with no adverse effect or increase in pain. Therapy today progressed periscapular strengthening and thoracic mobility. She again responded well to manual therapy interventions, reporting decreased pain post session. She continues to benefit from skilled PT and will continue to be seen and progressed as able per POC.    OBJECTIVE IMPAIRMENTS decreased ROM, decreased strength, increased muscle spasms, impaired flexibility, and pain.    ACTIVITY LIMITATIONS community activity, driving, occupation, and yard work.    PERSONAL FACTORS Time since onset of injury/illness/exacerbation are also affecting  patient's functional outcome.     GOALS: Goals reviewed with patient? No   SHORT TERM GOALS: Target date: 05/18/2022   Pt will be compliant and knowledgeable with initial  HEP for improved comfort and carryover Baseline: initial HEP given  Goal status: MET   2.  Pt will self report neck and R shoulder pain no greater than 6/10 for improved comfort and functional ability Baseline: 10/10 at worst Goal status: MET   LONG TERM GOALS: Target date: 06/22/2022   Pt will self report neck and R shoulder pain no greater than 3/10 for improved comfort and functional ability Baseline: 10/10 at worst Goal status: INITIAL   2.  Pt will improve shoulder FOTO function score to no less than 65% as proxy for functional improvement Baseline: 57% function Goal status: INITIAL   3.  Pt will improve neck FOTO function score to no less than 58% as proxy for functional improvement Baseline: 48% function Goal status: INITIAL   4.  Pt will increase R cervical rotation to no less than 55 deg for improved functional ability with driving Baseline: 40 deg Goal status: INITIAL   5.  Pt will improve Cervical Flexion Endurance Test to no less than 20 seconds hold time in order to improve postural endurance and decrease pain Baseline: 9 seconds Goal status: INITIAL   PLAN: PT FREQUENCY: 1-2x/week   PT DURATION: 10 weeks   PLANNED INTERVENTIONS: Therapeutic exercises, Therapeutic activity, Neuromuscular re-education, Balance training, Gait training, Patient/Family education, Joint mobilization, Dry Needling, Electrical stimulation, Cryotherapy, Moist heat, and Manual therapy   PLAN FOR NEXT SESSION: assess HEP response, DNF and periscapular strengthening, manual to R upper trap and neck    Ward Chatters, PT 06/08/2022, 4:13 PM

## 2022-06-11 ENCOUNTER — Ambulatory Visit: Payer: No Typology Code available for payment source

## 2022-06-11 DIAGNOSIS — M542 Cervicalgia: Secondary | ICD-10-CM

## 2022-06-11 DIAGNOSIS — G8929 Other chronic pain: Secondary | ICD-10-CM

## 2022-06-11 DIAGNOSIS — M6281 Muscle weakness (generalized): Secondary | ICD-10-CM

## 2022-06-11 NOTE — Therapy (Addendum)
OUTPATIENT PHYSICAL THERAPY TREATMENT NOTE/DISCHARGE  PHYSICAL THERAPY DISCHARGE SUMMARY  Visits from Start of Care: 5  Current functional level related to goals / functional outcomes: Unable to assess   Remaining deficits: Unable to assess   Education / Equipment: N/A   Patient agrees to discharge. Patient goals were  unable to assess . Patient is being discharged due to not returning since the last visit.   Patient Name: [name] MRN: [id number] DOB:07-23-1961, 61 y.o., female Today's Date: 09/07/2022  REFERRING PROVIDER: Elba Barman, NP  END OF SESSION:        Past Medical History:  Diagnosis Date   Anxiety    Arthritis    Past Surgical History:  Procedure Laterality Date   ABDOMINAL HYSTERECTOMY  06/20/2009   full   TUBAL LIGATION     Patient Active Problem List   Diagnosis Date Noted   Biceps tendonosis of right shoulder 11/27/2020   Impingement syndrome of right shoulder 11/17/2020    REFERRING DIAG: chronic neck and R shoulder pain  THERAPY DIAG:  Cervicalgia - Plan: PT plan of care cert/re-cert  Muscle weakness (generalized) - Plan: PT plan of care cert/re-cert  Chronic right shoulder pain - Plan: PT plan of care cert/re-cert  Rationale for Evaluation and Treatment Rehabilitation  PERTINENT HISTORY:  Anxiety  PRECAUTIONS:  None  SUBJECTIVE:  Pt presents to PT with reports of continued neck and shoulder discomfort. She has been compliant with HEP with no adverse effect. Pt is ready to begin PT at this time.   PAIN:  Are you having pain?  Yes: NPRS scale: 4/10 - 10/10 at worst Pain location: R upper trap, R neck, R shoulder Aggravating factors: unsure Relieving factors: rest, voltarin    OBJECTIVE: (objective measures completed at initial evaluation unless otherwise dated)   PATIENT SURVEYS:  FOTO Shoulder: 62% function - 06/11/2022 FOTO Neck: 55% function - 06/11/2022  PALPATION: TTP to R upper trap, R  cervical paraspinals          CERVICAL ROM:    Active ROM A/PROM (deg) 04/27/2022 AROM 09/07/2022   Right rotation 40 52  Left rotation 55 55   (Blank rows = not tested)     UE MMT:           MMT Right 04/27/22   Left  04/27/22   Shoulder flexion 4/5 4+/5  Shoulder abduction 4/5 4+/5  Shoulder ER 4/5 4+/5  Shoulder IR 4/5 4+/5  Middle trapezius      Lower trapezius      Shoulder extension      Grip strength      Cervical flexion (C1,C2)      Cervical S/B (C3)      Shoulder shrug (C4)      Elbow flexion (C6)      Elbow ext (C7)      Thumb ext (C8)      Finger abd (T1)      (Blank rows = not tested)    FUNCTIONAL TESTS:  Cervical Flexion Endurance Test: 9 seconds   TODAY'S TREATMENT:  [facility] Adult PT Treatment:                                                DATE: 06/11/2022 Therapeutic Exercise: UBE lvl 2.0 x 4 min while taking subjective (59fd/2bwd) Row 3x12 20# Shoulder ext 2x10  23# Shoulder ER x 10 3# each Shoulder IR x 10 7# each Seated horizontal abd x 15 GTB Seated bilateral ER 2x10 GTB Supine chin tuck 2x10 - 5" hold Cat Cow x 10 S/L open book x 10 each Manual Therapy: Positional release to R upper trap Suboccipital release  [facility] Adult PT Treatment:                                                DATE: 06/08/2022 Therapeutic Exercise: UBE lvl 2.0 x 4 min while taking subjective (55fd/2bwd) Row 3x12 20# Shoulder ext 2x10 23# Shoulder ER 2x10 3# each Shoulder IR 2x10 7# each Seated horizontal abd x 15 GTB Seated bilateral ER 2x10 GTB Supine chin tuck 2x10 - 5" hold Cat Cow x 10 S/L open book x 10 each Manual Therapy: Positional release to R upper trap Suboccipital release Prone Grade III PA C7-T4 Prone thoracic grade V thrust manipulation  [facility] Adult PT Treatment:                                                DATE: 06/04/2022 Therapeutic Exercise: UBE L1.0 2/2 Supine press 2# 15x Supine flexion 2# 15x Sidelie R ER 2# 12x Sidelie abduction 2#  7x Manual Therapy: STM to R pec minor and levator   PATIENT EDUCATION:  Education details: continue HEP with updates Person educated: Patient Education method: Explanation, Demonstration, and Handouts Education comprehension: verbalized understanding and returned demonstration   HOME EXERCISE PROGRAM: Access Code: [email or web address] URL: [email or web address] Date: 06/11/2022 Prepared by: DOctavio Manns Exercises - Seated Assisted Cervical Rotation with Towel  - 1 x daily - 7 x weekly - 2 sets - 10 reps - 5 sec hold - Supine Chin Tuck  - 1 x daily - 7 x weekly - 2 sets - 10 reps - 5 sec hold - Seated Upper Trapezius Stretch  - 2 x daily - 7 x weekly - 2 reps - 30 sec hold - Standing Shoulder Row with Anchored Resistance  - 1 x daily - 7 x weekly - 3 sets - 10 reps - Cat Cow  - 1 x daily - 7 x weekly - 2 sets - 10 reps - Sidelying Thoracic Rotation with Open Book  - 1 x daily - 7 x weekly - 2 sets - 10 reps - Shoulder External Rotation and Scapular Retraction with Resistance  - 1 x daily - 7 x weekly - 2-3 sets - 10 reps   ASSESSMENT:   CLINICAL IMPRESSION:  Pt was able to complete all prescribed exercises with no adverse effect or increase in pain. Over the course of PT treatment she has been improving her strength and functional mobility, demonstrating improving activity tolerance and cervical ROM. Pt's FOTO score has improved for both her shoulder and neck, showing subjective improvement in functional ability. She has benefited greatly from skilled PT thus far and would continue to benefit from skilled services. Will ask for extension of authorized visits and continue to progress as tolerated.    OBJECTIVE IMPAIRMENTS decreased ROM, decreased strength, increased muscle spasms, impaired flexibility, and pain.    ACTIVITY LIMITATIONS community activity, driving, occupation, and yard work.    PERSONAL FACTORS Time since onset of  injury/illness/exacerbation are also affecting  patient's functional outcome.     GOALS: Goals reviewed with patient? No   SHORT TERM GOALS: Target date: 05/18/2022   Pt will be compliant and knowledgeable with initial HEP for improved comfort and carryover Baseline: initial HEP given  Goal status: MET   2.  Pt will self report neck and R shoulder pain no greater than 6/10 for improved comfort and functional ability Baseline: 10/10 at worst Goal status: MET   LONG TERM GOALS: Target date: 07/23/2022   Pt will self report neck and R shoulder pain no greater than 3/10 for improved comfort and functional ability Baseline: 10/10 at worst Goal status: ONGOING   2.  Pt will improve shoulder FOTO function score to no less than 65% as proxy for functional improvement Baseline: 57% function 06/11/2022: 62% function Goal status: ONGOING   3.  Pt will improve neck FOTO function score to no less than 58% as proxy for functional improvement Baseline: 48% function 06/11/2022: 55% function Goal status: ONGOING   4.  Pt will increase R cervical rotation to no less than 55 deg for improved functional ability with driving Baseline: 40 deg 06/11/2022: 50 deg Goal status: ONGOING   5.  Pt will improve Cervical Flexion Endurance Test to no less than 20 seconds hold time in order to improve postural endurance and decrease pain Baseline: 9 seconds Goal status: ONGOING   PLAN: PT FREQUENCY: 1-2x/week   PT DURATION: 10 weeks   PLANNED INTERVENTIONS: Therapeutic exercises, Therapeutic activity, Neuromuscular re-education, Balance training, Gait training, Patient/Family education, Joint mobilization, Dry Needling, Electrical stimulation, Cryotherapy, Moist heat, and Manual therapy   PLAN FOR NEXT SESSION: assess HEP response, DNF and periscapular strengthening, manual to R upper trap and neck    Ward Chatters, PT 09/07/2022, 8:24 AM

## 2022-06-18 ENCOUNTER — Ambulatory Visit: Payer: No Typology Code available for payment source

## 2023-04-12 ENCOUNTER — Encounter: Payer: Self-pay | Admitting: Neurology

## 2023-05-19 ENCOUNTER — Ambulatory Visit: Payer: Non-veteran care | Admitting: Neurology

## 2023-05-31 ENCOUNTER — Ambulatory Visit: Payer: Non-veteran care | Admitting: Neurology

## 2023-06-21 ENCOUNTER — Encounter: Payer: Self-pay | Admitting: Neurology

## 2023-06-21 ENCOUNTER — Ambulatory Visit (INDEPENDENT_AMBULATORY_CARE_PROVIDER_SITE_OTHER): Payer: No Typology Code available for payment source | Admitting: Neurology

## 2023-06-21 VITALS — BP 123/81 | HR 63 | Ht 64.0 in | Wt 160.0 lb

## 2023-06-21 DIAGNOSIS — E236 Other disorders of pituitary gland: Secondary | ICD-10-CM

## 2023-06-21 DIAGNOSIS — G08 Intracranial and intraspinal phlebitis and thrombophlebitis: Secondary | ICD-10-CM

## 2023-06-21 DIAGNOSIS — R519 Headache, unspecified: Secondary | ICD-10-CM

## 2023-06-21 DIAGNOSIS — G8929 Other chronic pain: Secondary | ICD-10-CM

## 2023-06-21 DIAGNOSIS — H4710 Unspecified papilledema: Secondary | ICD-10-CM

## 2023-06-21 DIAGNOSIS — G932 Benign intracranial hypertension: Secondary | ICD-10-CM

## 2023-06-21 MED ORDER — RIZATRIPTAN BENZOATE 10 MG PO TBDP: 10.0000 mg | ORAL_TABLET | ORAL | 11 refills | Status: AC | PRN

## 2023-06-21 NOTE — Progress Notes (Signed)
[facility]    Provider:  Dr Lucia Gaskins Requesting Provider: Vick Frees* Primary Care Provider:  Administration, [facility]  CC:  empty sella and headaches  HPI:  [name] is a 62 y.o. female here as requested by Vick Frees* for partilly empty sella.   Patient is here alone. Ongoing 1.5 years, has had migraines in the past but this is different. She can tell the difference between this new headache and her migraines. Starts with burning in a band around the head, episodes of blurry vision and eye drops help because she has dry eyes but sometimes she blinks and it goes away. Can be very painful, she has to lay down and try not to talk tylenol. More pressure in the head. Pressure behind the eyes. Feels like ears are muffled. She had hysterectomy and she is not on any  estrogen. No long term accutane or vitamin A. No IUD, no long-term antbiotics. She fell returning a truck and hit her head 3 years ago in occipital lobe, no hx of meningitis or multiple concussions/head trauma. She went to the ophthalmologist right before MRI showing empty sella. She has migraines with pulsating/throbbing inilateral, light and sound sensitivity, nausea, no vomiting but this is different. She hasn't had a migraine for the past 9 month very sporadic. This new headache she had 10-15 headache days a month that last all day. Unknown triggers. Sleep and a dark room helps and a cold rag. It is extreme pressure not migrainous.   Reviewed notes, labs and imaging from outside physicians, which showed:  I reviewed notes from the [place] which were sparse, brief history partially empty sella with increasing headaches, recent ophthalmology exam revealed normal optic disks without evidence of papilledema;  MRI of the brain impression reviewed report  February 19, 2023: Partially empty sella expanded sella is a nonspecific finding but can be seen in the setting of intracranial hypertension.  No  additional findings of intracranial hypertension identified.  Clinical correlation such as funduscopic examination is recommended  MRI of the cervical spine reviewed report February 19, 2023: Mild multilevel degenerative changes of the cervical spine without significant spinal canal or neuroforaminal narrowing.  Trace fluid at the atlantodental dental interval is minimally decreased as compared to 08/26/2018 correlating with instability previously demonstrated on comparison radiographs.  03/01/2010: Clinical Data:  .  Left side numbness and weakness.  Hypertension.    CT HEAD WITHOUT CONTRAST : reviewed images and agree   Technique:  Contiguous axial images were obtained from the base of  the skull through the vertex without contrast    Comparison:  None.    Findings:  The brain has a normal appearance without evidence for  hemorrhage, acute infarction, hydrocephalus, or mass lesion.  There  is no extra axial fluid collection.  The skull and paranasal  sinuses are normal.    IMPRESSION:  Normal CT of the head without contrast     Review of Systems: Patient complains of symptoms per HPI as well as the following symptoms migraines. Pertinent negatives and positives per HPI. All others negative.   Social History   Socioeconomic History   Marital status: Divorced    Spouse name: Not on file   Number of children: Not on file   Years of education: Not on file   Highest education level: Not on file  Occupational History   Not on file  Tobacco Use   Smoking status: Never   Smokeless tobacco: Never  Substance and Sexual Activity  Alcohol use: Yes    Comment: seldom beer   Drug use: Not on file   Sexual activity: Not on file  Other Topics Concern   Not on file  Social History Narrative   Not on file   Social Determinants of Health   Financial Resource Strain: Not on file  Food Insecurity: Not on file  Transportation Needs: Not on file  Physical Activity: Not on file  Stress:  Not on file  Social Connections: Not on file  Intimate Partner Violence: Not on file    Family History  Problem Relation Age of Onset   Migraines Neg Hx    Headache Neg Hx     Past Medical History:  Diagnosis Date   Anxiety    Arthritis     Patient Active Problem List   Diagnosis Date Noted   Biceps tendonosis of right shoulder 11/27/2020   Impingement syndrome of right shoulder 11/17/2020    Past Surgical History:  Procedure Laterality Date   ABDOMINAL HYSTERECTOMY  06/20/2009   full   TUBAL LIGATION      Current Outpatient Medications  Medication Sig Dispense Refill   cholecalciferol (VITAMIN D) 400 units TABS tablet Take 400 Units by mouth.     Probiotic Product (PROBIOTIC PO) Take by mouth.     rizatriptan (MAXALT-MLT) 10 MG disintegrating tablet Take 1 tablet (10 mg total) by mouth as needed for migraine. May repeat in 2 hours if needed 9 tablet 11   sennosides-docusate sodium (SENOKOT-S) 8.6-50 MG tablet Take 1 tablet by mouth daily.     No current facility-administered medications for this visit.    Allergies as of 06/21/2023   (No Known Allergies)    Vitals: BP 123/81   Pulse 63   Ht 5\' 4"  (1.626 m)   Wt 160 lb (72.6 kg)   BMI 27.46 kg/m  Last Weight:  Wt Readings from Last 1 Encounters:  06/21/23 160 lb (72.6 kg)   Last Height:   Ht Readings from Last 1 Encounters:  06/21/23 5\' 4"  (1.626 m)     Physical exam: Exam: Gen: NAD, conversant, well nourised, well groomed                     CV: RRR, no MRG. No Carotid Bruits. No peripheral edema, warm, nontender Eyes: Conjunctivae clear without exudates or hemorrhage  Neuro: Detailed Neurologic Exam  Speech:    Speech is normal; fluent and spontaneous with normal comprehension.  Cognition:    The patient is oriented to person, place, and time;     recent and remote memory intact;     language fluent;     normal attention, concentration,     fund of knowledge Cranial Nerves:    The  pupils are equal, round, and reactive to light.  Slightly blurry optic nerve head margins possibly 1+ papilledema versus pseudopapilledema.  Visual fields are full to finger confrontation. Extraocular movements are intact. Trigeminal sensation is intact and the muscles of mastication are normal. The face is symmetric. The palate elevates in the midline. Hearing intact. Voice is normal. Shoulder shrug is normal. The tongue has normal motion without fasciculations.   Coordination:    Normal finger to nose and heel to shin. Normal rapid alternating movements.   Gait:    Heel-toe and tandem gait are normal.   Motor Observation:    No asymmetry, no atrophy, and no involuntary movements noted. Tone:    Normal muscle tone.  Posture:    Posture is normal. normal erect    Strength:    Strength is V/V in the upper and lower limbs.      Sensation: intact to LT     Reflex Exam:  DTR's:    Deep tendon reflexes in the upper and lower extremities are normal bilaterally.   Toes:    The toes are downgoing bilaterally.   Clonus:    Clonus is absent.    Assessment/Plan: Patient with new onset headache quality, she does have a history of migraines but these are different, MRI of the brain showed partially empty sella, differential includes IDIOPATHIC INTRACRANIAL HYPERTENSION vs transformed migraines.  MRI of the brain was completed at the [facility] and report was not included, we had her fill out records release and we will see if we can get more records.  CT Veins to look for stenosis or thrombosis due to papilledema,, increased intracranial pressure Lumbar puncture for opening pressure get fluid out for testing and to see if that makes your headaches better Weight loss? Look up [email or web address] May also be transformed migraines  Rizatriptan: Please take one tablet at the onset of your headache. If it does not improve the symptoms please take one additional tablet in 2 hours. Do not take more then 2 tablets in  24hrs.  Ophthalmology was by report without papilledema however her optic nerve head margins do appear slightly blurred If negative for IIH will treat for migraines. If positive, will start diamox low dose 250mg  bid and slowly increase   Orders Placed This Encounter  Procedures   CT VENOGRAM HEAD   DG FLUORO GUIDED LOC OF NEEDLE/CATH TIP FOR SPINAL INJECT LT   CBC with Differential/Platelets   Comprehensive metabolic panel   TSH Rfx on Abnormal to Free T4   Meds ordered this encounter  Medications   rizatriptan (MAXALT-MLT) 10 MG disintegrating tablet    Sig: Take 1 tablet (10 mg total) by mouth as needed for migraine. May repeat in 2 hours if needed    Dispense:  9 tablet    Refill:  11    Cc: [place]-Laurent, Mort Sawyers*,  Administration, [facility]  Naomie Dean, MD  [facility] [address] [place], [place] [address]  Phone [phone] Fax [phone]

## 2023-06-21 NOTE — Patient Instructions (Addendum)
CT Veins Lumbar puncture for opening pressure get fluid out for testing and to see if that makes your headaches better Weight loss? Look up wegovy May also be transformed migraines  Rizatriptan: Please take one tablet at the onset of your headache. If it does not improve the symptoms please take one additional tablet in 2 hours. Do not take more then 2 tablets in 24hrs.   IDIOPATHIC INTRACRANIAL HYPERTENSION vs transformed migraines    Idiopathic Intracranial Hypertension  Idiopathic intracranial hypertension (IIH) is a condition that increases pressure around the brain. The fluid that surrounds the brain and spinal cord (cerebrospinal fluid, or CSF) increases and causes the pressure. Idiopathic means that the cause of this condition is not known. IIH affects the brain and spinal cord. If this condition is not treated, it can cause vision loss or blindness. What are the causes? The cause of this condition is not known. What increases the risk? The following factors may make you more likely to develop this condition: Being obese. Being a person who is female, between the ages of 32 and 58 years old, and who has not gone through menopause. Taking certain medicines, such as birth control, acne medicines, or steroids. What are the signs or symptoms? Symptoms of this condition include: Headaches. This is the most common symptom. Brief periods of total blindness. Double vision, blurred vision, or poor side (peripheral) vision. Pain in the shoulders or neck. Nausea and vomiting. A sound like rushing water or a pulsing sound within the ears (pulsatile tinnitus), or ringing in the ears. How is this diagnosed? This condition may be diagnosed based on: Your symptoms and medical history. Imaging tests of the brain, such as: CT scan. MRI. Magnetic resonance venogram (MRV) to check the veins. Diagnostic lumbar puncture. This is a procedure to remove and examine a sample of CSF. This procedure  can determine whether your fluid pressure is too high. An eye exam to check for swelling or nerve damage in the eyes. How is this treated? Treatment for this condition depends on the symptoms. The goal of treatment is to decrease the pressure around your brain. Common treatments include: Weight loss through healthy eating, salt restriction, and exercise, if you are overweight. Medicines to decrease the production of CSF and lower the pressure within your skull. Medicines to prevent or treat headaches. Other treatments may include: Surgery to place drains (shunts) in your brain to remove extra fluid. Lumbar puncture to remove extra CSF. Follow these instructions at home: If you are overweight or obese, work with your health care provider to lose weight. Take over-the-counter and prescription medicines only as told by your health care provider. Ask your health care provider if the medicine prescribed to you requires you to avoid driving or using machinery. Do not use any products that contain nicotine or tobacco. These products include cigarettes, chewing tobacco, and vaping devices, such as e-cigarettes. If you need help quitting, ask your health care provider. Keep all follow-up visits. Your health care provider will need to monitor you regularly. Contact a health care provider if: You have changes in your vision, such as: Double vision. Blurred vision. Poor peripheral vision. Get help right away if: You have any of the following symptoms and they get worse or do not get better: Headaches. Nausea. Vomiting. Sudden trouble seeing. This information is not intended to replace advice given to you by your health care provider. Make sure you discuss any questions you have with your health care provider. Rizatriptan Disintegrating  Tablets What is this medication? RIZATRIPTAN (rye za TRIP tan) treats migraines. It works by blocking pain signals and narrowing blood vessels in the brain. It  belongs to a group of medications called triptans. It is not used to prevent migraines. This medicine may be used for other purposes; ask your health care provider or pharmacist if you have questions. COMMON BRAND NAME(S): Maxalt-MLT What should I tell my care team before I take this medication? They need to know if you have any of these conditions: Circulation problems in fingers and toes Diabetes Heart disease High blood pressure High cholesterol History of irregular heartbeat History of stroke Stomach or intestine problems Tobacco use An unusual or allergic reaction to rizatriptan, other medications, foods, dyes, or preservatives Pregnant or trying to get pregnant Breast-feeding How should I use this medication? Take this medication by mouth. Take it as directed on the prescription label. You do not need water to take this medication. Leave the tablet in the sealed pack until you are ready to take it. With dry hands, open the pack and gently remove the tablet. If the tablet breaks or crumbles, throw it away. Use a new tablet. Place the tablet on the tongue and allow it to dissolve. Then, swallow it. Do not cut, crush, or chew this medication. Do not use it more often than directed. Talk to your care team about the use of this medication in children. While it may be prescribed for children as young as 6 years for selected conditions, precautions do apply. Overdosage: If you think you have taken too much of this medicine contact a poison control center or emergency room at once. NOTE: This medicine is only for you. Do not share this medicine with others. What if I miss a dose? This does not apply. This medication is not for regular use. What may interact with this medication? Do not take this medication with any of the following: Ergot alkaloids, such as dihydroergotamine, ergotamine MAOIs, such as Marplan, Nardil, Parnate Other medications for migraine headache, such as almotriptan,  eletriptan, frovatriptan, naratriptan, sumatriptan, zolmitriptan This medication may also interact with the following: Certain medications for depression, anxiety, or other mental health conditions Propranolol This list may not describe all possible interactions. Give your health care provider a list of all the medicines, herbs, non-prescription drugs, or dietary supplements you use. Also tell them if you smoke, drink alcohol, or use illegal drugs. Some items may interact with your medicine. What should I watch for while using this medication? Visit your care team for regular checks on your progress. Tell your care team if your symptoms do not start to get better or if they get worse. This medication may affect your coordination, reaction time, or judgment. Do not drive or operate machinery until you know how this medication affects you. Sit up or stand slowly to reduce the risk of dizzy or fainting spells. If you take migraine medications for 10 or more days a month, your migraines may get worse. Keep a diary of headache days and medication use. Contact your care team if your migraine attacks occur more frequently. What side effects may I notice from receiving this medication? Side effects that you should report to your care team as soon as possible: Allergic reactions--skin rash, itching, hives, swelling of the face, lips, tongue, or throat Burning, pain, tingling, or color changes in the hands, arms, legs, or feet Heart attack--pain or tightness in the chest, shoulders, arms, or jaw, nausea, shortness of breath, cold  or clammy skin, feeling faint or lightheaded Heart rhythm changes--fast or irregular heartbeat, dizziness, feeling faint or lightheaded, chest pain, trouble breathing Increase in blood pressure Irritability, confusion, fast or irregular heartbeat, muscle stiffness, twitching muscles, sweating, high fever, seizure, chills, vomiting, diarrhea, which may be signs of serotonin  syndrome Raynaud syndrome--cool, numb, or painful fingers or toes that may change color from pale, to blue, to red Seizures Stroke--sudden numbness or weakness of the face, arm, or leg, trouble speaking, confusion, trouble walking, loss of balance or coordination, dizziness, severe headache, change in vision Sudden or severe stomach pain, bloody diarrhea, fever, nausea, vomiting Vision loss Side effects that usually do not require medical attention (report to your care team if they continue or are bothersome): Dizziness Unusual weakness or fatigue This list may not describe all possible side effects. Call your doctor for medical advice about side effects. You may report side effects to FDA at 1-800-FDA-1088. Where should I keep my medication? Keep out of the reach of children and pets. Store at room temperature between 15 and 30 degrees C (59 and 86 degrees F). Protect from light and moisture. Get rid of any unused medication after the expiration date. To get rid of medications that are no longer needed or have expired: Take the medication to a medication take-back program. Check with your pharmacy or law enforcement to find a location. If you cannot return the medication, check the label or package insert to see if the medication should be thrown out in the garbage or flushed down the toilet. If you are not sure, ask your care team. If it is safe to put it in the trash, empty the medication out of the container. Mix the medication with cat litter, dirt, coffee grounds, or other unwanted substance. Seal the mixture in a bag or container. Put it in the trash. NOTE: This sheet is a summary. It may not cover all possible information. If you have questions about this medicine, talk to your doctor, pharmacist, or health care provider.  2024 Elsevier/Gold Standard (2022-04-09 00:00:00)  Document Revised: 05/05/2022 Document Reviewed: 04/14/2022 Elsevier Patient Education  2024 [employer].

## 2023-06-22 LAB — CBC WITH DIFFERENTIAL/PLATELET

## 2023-06-22 LAB — COMPREHENSIVE METABOLIC PANEL
Creatinine, Ser: 0.67 mg/dL (ref 0.57–1.00)
Globulin, Total: 3.2 g/dL (ref 1.5–4.5)

## 2023-06-22 LAB — TSH RFX ON ABNORMAL TO FREE T4: TSH: 0.709 u[IU]/mL (ref 0.450–4.500)

## 2023-06-23 ENCOUNTER — Telehealth: Payer: Self-pay | Admitting: Neurology

## 2023-06-23 LAB — CBC WITH DIFFERENTIAL/PLATELET
EOS (ABSOLUTE): 0 10*3/uL (ref 0.0–0.4)
Eos: 1 %
Hematocrit: 43.9 % (ref 34.0–46.6)
Immature Grans (Abs): 0 10*3/uL (ref 0.0–0.1)
Lymphocytes Absolute: 2.1 10*3/uL (ref 0.7–3.1)
MCH: 25.4 pg — ABNORMAL LOW (ref 26.6–33.0)
MCHC: 32.1 g/dL (ref 31.5–35.7)
MCV: 79 fL (ref 79–97)
Monocytes Absolute: 0.3 10*3/uL (ref 0.1–0.9)
Monocytes: 7 %
Neutrophils Absolute: 1 10*3/uL — ABNORMAL LOW (ref 1.4–7.0)
Platelets: 204 10*3/uL (ref 150–450)
RBC: 5.56 x10E6/uL — ABNORMAL HIGH (ref 3.77–5.28)
RDW: 15.8 % — ABNORMAL HIGH (ref 11.7–15.4)
WBC: 3.4 10*3/uL (ref 3.4–10.8)

## 2023-06-23 LAB — COMPREHENSIVE METABOLIC PANEL WITH GFR
AST: 25 IU/L (ref 0–40)
Albumin: 4.5 g/dL (ref 3.9–4.9)
BUN/Creatinine Ratio: 10 — ABNORMAL LOW (ref 12–28)
BUN: 7 mg/dL — ABNORMAL LOW (ref 8–27)
Calcium: 9.9 mg/dL (ref 8.7–10.3)
Glucose: 79 mg/dL (ref 70–99)
Total Protein: 7.7 g/dL (ref 6.0–8.5)
eGFR: 98 mL/min/{1.73_m2} (ref >59)

## 2023-06-23 LAB — COMPREHENSIVE METABOLIC PANEL
ALT: 15 IU/L (ref 0–32)
Alkaline Phosphatase: 70 IU/L (ref 44–121)
Bilirubin Total: 0.8 mg/dL (ref 0.0–1.2)
CO2: 26 mmol/L (ref 20–29)
Chloride: 105 mmol/L (ref 96–106)
Potassium: 4.2 mmol/L (ref 3.5–5.2)
Sodium: 143 mmol/L (ref 134–144)

## 2023-06-23 NOTE — Telephone Encounter (Signed)
[facility] Berkley HarveyJanae Bridgeman: [id number] exp. 03/05/23 - 11/15/2023 sent to [facility] [phone]

## 2023-07-16 ENCOUNTER — Inpatient Hospital Stay: Admission: RE | Admit: 2023-07-16 | Payer: Non-veteran care | Source: Ambulatory Visit

## 2023-08-05 ENCOUNTER — Ambulatory Visit: Payer: Non-veteran care | Admitting: Neurology

## 2023-08-05 ENCOUNTER — Encounter: Payer: Self-pay | Admitting: Neurology

## 2023-12-29 ENCOUNTER — Ambulatory Visit
Admission: EM | Admit: 2023-12-29 | Discharge: 2023-12-29 | Disposition: A | Discharge status: Home / Self Care | Payer: Non-veteran care | Attending: Family Medicine | Admitting: Family Medicine

## 2023-12-29 ENCOUNTER — Telehealth: Payer: Self-pay

## 2023-12-29 DIAGNOSIS — B9689 Other specified bacterial agents as the cause of diseases classified elsewhere: Secondary | ICD-10-CM

## 2023-12-29 DIAGNOSIS — J208 Acute bronchitis due to other specified organisms: Secondary | ICD-10-CM | POA: Diagnosis not present

## 2023-12-29 MED ORDER — PROMETHAZINE-DM 6.25-15 MG/5ML PO SYRP: 2.5000 mL | ORAL_SOLUTION | Freq: Three times a day (TID) | ORAL | 0 refills | Status: DC | PRN

## 2023-12-29 MED ORDER — PREDNISONE 20 MG PO TABS: ORAL_TABLET | ORAL | 0 refills | Status: AC

## 2023-12-29 MED ORDER — PROMETHAZINE-DM 6.25-15 MG/5ML PO SYRP: 2.5000 mL | ORAL_SOLUTION | Freq: Three times a day (TID) | ORAL | 0 refills | Status: AC | PRN

## 2023-12-29 MED ORDER — AZITHROMYCIN 250 MG PO TABS: ORAL_TABLET | ORAL | 0 refills | Status: DC

## 2023-12-29 MED ORDER — PREDNISONE 20 MG PO TABS: ORAL_TABLET | ORAL | 0 refills | Status: DC

## 2023-12-29 MED ORDER — AZITHROMYCIN 250 MG PO TABS: ORAL_TABLET | ORAL | 0 refills | Status: AC

## 2023-12-29 NOTE — Discharge Instructions (Signed)
 We will manage this as a bacterial bronchitis with azithromycin  and prednisone . For sore throat or cough try using a honey-based tea. Use 3 teaspoons of honey with juice squeezed from half lemon. Place shaved pieces of ginger into 1/2-1 cup of water and warm over stove top. Then mix the ingredients and repeat every 4 hours as needed. Please take Tylenol 500mg -650mg  once every 6 hours for fevers, aches and pains. Hydrate very well with at least 2 liters (64 ounces) of water. Eat light meals such as soups (chicken and noodles, chicken wild rice, vegetable).  Do not eat any foods that you are allergic to.  Start an antihistamine like Zyrtec (10mg  daily) for postnasal drainage, sinus congestion.  You can take this together with cough medication as needed.

## 2023-12-29 NOTE — ED Provider Notes (Signed)
 [facility]  Note:  This document was prepared using Conservation officer, historic buildings and may include unintentional dictation errors.  MRN: [id number] DOB: January 27, 1961  Subjective:   [name] is a 63 y.o. female presenting for 10-day history of acute onset worsening malaise, persistent coughing that elicits chest tightness and shortness of breath.  She is also felt achy, has back pains with her cough.  She did have very interim improvement before her symptoms returned.  No history of asthma.  No current facility-administered medications for this encounter.  Current Outpatient Medications:    cholecalciferol (VITAMIN D) 400 units TABS tablet, Take 400 Units by mouth., Disp: , Rfl:    Probiotic Product (PROBIOTIC PO), Take by mouth., Disp: , Rfl:    rizatriptan  (MAXALT -MLT) 10 MG disintegrating tablet, Take 1 tablet (10 mg total) by mouth as needed for migraine. May repeat in 2 hours if needed, Disp: 9 tablet, Rfl: 11   sennosides-docusate sodium (SENOKOT-S) 8.6-50 MG tablet, Take 1 tablet by mouth daily., Disp: , Rfl:    Allergies  Allergen Reactions   Hydrocodone Other (See Comments)    Past Medical History:  Diagnosis Date   Anxiety    Arthritis      Past Surgical History:  Procedure Laterality Date   ABDOMINAL HYSTERECTOMY  06/20/2009   full   TUBAL LIGATION      Family History  Problem Relation Age of Onset   Migraines Neg Hx    Headache Neg Hx     Social History   Tobacco Use   Smoking status: Never   Smokeless tobacco: Never  Vaping Use   Vaping status: Never Used  Substance Use Topics   Alcohol use: Yes    Comment: seldom beer   Drug use: Never    ROS   Objective:   Vitals: BP (!) 142/85 (BP Location: Left Arm)   Pulse 81   Temp 98.4 F (36.9 C) (Oral)   Resp 18   SpO2 98%   Physical Exam Constitutional:      General: She is not in acute distress.    Appearance: Normal appearance. She is well-developed. She  is not ill-appearing, toxic-appearing or diaphoretic.  HENT:     Head: Normocephalic and atraumatic.     Nose: Nose normal.     Mouth/Throat:     Mouth: Mucous membranes are moist.  Eyes:     General: No scleral icterus.       Right eye: No discharge.        Left eye: No discharge.     Extraocular Movements: Extraocular movements intact.  Cardiovascular:     Rate and Rhythm: Normal rate and regular rhythm.     Heart sounds: Normal heart sounds. No murmur heard.    No friction rub. No gallop.  Pulmonary:     Effort: Pulmonary effort is normal. No respiratory distress.     Breath sounds: No stridor. Rhonchi present. No decreased breath sounds, wheezing or rales.  Chest:     Chest wall: No tenderness.  Skin:    General: Skin is warm and dry.  Neurological:     General: No focal deficit present.     Mental Status: She is alert and oriented to person, place, and time.  Psychiatric:        Mood and Affect: Mood normal.        Behavior: Behavior normal.     Assessment and Plan :   PDMP not reviewed  this encounter.  1. Acute bacterial bronchitis    Will manage for bacterial bronchitis with azithromycin  and a prednisone  course for her respiratory symptoms recommend general supportive care otherwise.  Counseled patient on potential for adverse effects with medications prescribed/recommended today, ER and return-to-clinic precautions discussed, patient verbalized understanding.    Christopher Savannah, [place] 12/29/23 1511

## 2023-12-29 NOTE — ED Triage Notes (Signed)
 Pt reports chest tightness, I feel like a whole in my back , cough and shortness of breath x 3 days. States she was sick x 1 week with this symptoms and fever and felt better for couple days and now she feels sick again. Pt not taking any meds as she has a colonoscopy on 12/31/23.

## 2024-07-25 ENCOUNTER — Encounter: Payer: Self-pay | Admitting: Internal Medicine

## 2024-07-25 ENCOUNTER — Ambulatory Visit: Payer: Self-pay | Admitting: Internal Medicine

## 2024-07-25 ENCOUNTER — Telehealth: Payer: Self-pay | Admitting: Internal Medicine

## 2024-07-25 VITALS — BP 135/82 | HR 76 | Temp 98.3°F | Resp 16 | Ht 64.0 in | Wt 166.2 lb

## 2024-07-25 DIAGNOSIS — K5900 Constipation, unspecified: Secondary | ICD-10-CM

## 2024-07-25 DIAGNOSIS — G4719 Other hypersomnia: Secondary | ICD-10-CM

## 2024-07-25 NOTE — Telephone Encounter (Signed)
 Awaiting 07/25/24 office notes for SS order-Toni

## 2024-07-25 NOTE — Progress Notes (Signed)
 [facility] [address] [place], [place] [address]  Pulmonary Sleep Medicine   Office Visit Note  Patient Name: [name] DOB: 09/25/1961 MRN [id number]  Date of Service: 07/25/2024  Complaints/HPI: She is here for sleep issues. She has difficulty staying asleep. Patient states she wakes 3am and cannot get back to sleep. She goes to bed at about 11pm. She states she does snore. She states shehas also gained weight approximately 20 pounds. She finds that she eats at night. She has noted migraines. She feels tired during the day. She does not sleep driving. She sometimes sleeps watching TV. She has brain fog. She has not had drop attacks. She has not had a sleep study in the past.  Office Spirometry Results:     ROS  General: (-) fever, (-) chills, (-) night sweats, (-) weakness Skin: (-) rashes, (-) itching,. Eyes: (-) visual changes, (-) redness, (-) itching. Nose and Sinuses: (-) nasal stuffiness or itchiness, (-) postnasal drip, (-) nosebleeds, (-) sinus trouble. Mouth and Throat: (-) sore throat, (-) hoarseness. Neck: (-) swollen glands, (-) enlarged thyroid, (-) neck pain. Respiratory: - cough, (-) bloody sputum, - shortness of breath, - wheezing. Cardiovascular: - ankle swelling, (-) chest pain. Lymphatic: (-) lymph node enlargement. Neurologic: (-) numbness, (-) tingling. Psychiatric: (-) anxiety, (-) depression   Current Medication: Outpatient Encounter Medications as of 07/25/2024  Medication Sig   azithromycin  (ZITHROMAX ) 250 MG tablet Day 1: take 2 tablets. Day 2-5: Take 1 tablet daily.   cholecalciferol (VITAMIN D) 400 units TABS tablet Take 400 Units by mouth.   predniSONE  (DELTASONE ) 20 MG tablet Take 2 tablets daily with breakfast.   Probiotic Product (PROBIOTIC PO) Take by mouth.   promethazine -dextromethorphan (PROMETHAZINE -DM) 6.25-15 MG/5ML syrup Take 2.5 mLs by mouth 3 (three) times daily as needed for cough.   rizatriptan  (MAXALT -MLT) 10  MG disintegrating tablet Take 1 tablet (10 mg total) by mouth as needed for migraine. May repeat in 2 hours if needed   sennosides-docusate sodium (SENOKOT-S) 8.6-50 MG tablet Take 1 tablet by mouth daily.   No facility-administered encounter medications on file as of 07/25/2024.    Surgical History: Past Surgical History:  Procedure Laterality Date   ABDOMINAL HYSTERECTOMY  06/20/2009   full   TUBAL LIGATION      Medical History: Past Medical History:  Diagnosis Date   Anxiety    Arthritis     Family History: Family History  Problem Relation Age of Onset   Migraines Neg Hx    Headache Neg Hx     Social History: Social History   Socioeconomic History   Marital status: Divorced    Spouse name: Not on file   Number of children: Not on file   Years of education: Not on file   Highest education level: Not on file  Occupational History   Not on file  Tobacco Use   Smoking status: Never   Smokeless tobacco: Never  Vaping Use   Vaping status: Never Used  Substance and Sexual Activity   Alcohol use: Yes    Comment: seldom beer   Drug use: Never   Sexual activity: Not Currently  Other Topics Concern   Not on file  Social History Narrative   Not on file   Social Drivers of Health   Financial Resource Strain: Not on file  Food Insecurity: Not on file  Transportation Needs: Not on file  Physical Activity: Not on file  Stress: Not on file  Social  Connections: Not on file  Intimate Partner Violence: Not on file    Vital Signs: Blood pressure 135/82, pulse 76, temperature 98.3 F (36.8 C), resp. rate 16, height 5' 4 (1.626 m), weight 166 lb 3.2 oz (75.4 kg), SpO2 98%.  Examination: General Appearance: The patient is well-developed, well-nourished, and in no distress. Skin: Gross inspection of skin unremarkable. Head: normocephalic, no gross deformities. Eyes: no gross deformities noted. ENT: ears appear grossly normal no exudates. Neck: Supple. No  thyromegaly. No LAD. Respiratory: no rhonchi noted. Cardiovascular: Normal S1 and S2 without murmur or rub. Extremities: No cyanosis. pulses are equal. Neurologic: Alert and oriented. No involuntary movements.  LABS: No results found for this or any previous visit (from the past 2160 hours).  Radiology: No results found.  No results found.  No results found.  Assessment and Plan: Patient Active Problem List   Diagnosis Date Noted   Biceps tendonosis of right shoulder 11/27/2020   Impingement syndrome of right shoulder 11/17/2020   1. Other hypersomnia (Primary) Patient has significant hypersomnia and symptoms consistent with obstructive sleep apnea poses to be a high risk patient so we will go ahead and schedule her for a in-lab study PSG. - PSG Sleep Study; Future  2. Obesity, morbid (HCC) Patient does have significant obesity with her weight 166 pounds needs to work on diet and exercise to work on weight loss.  This would also help with her blood pressure control as well as with her sleep disordered breathing potential.  3. Constipation, unspecified constipation type She did also complain about constipation which raises concerns about thyroid function so we will go ahead and get a TSH T4.  In addition to doing this for her hypersomnia - TSH + free T4    General Counseling: I have discussed the findings of the evaluation and examination with [name].  I have also discussed any further diagnostic evaluation thatmay be needed or ordered today. [name] verbalizes understanding of the findings of todays visit. We also reviewed her medications today and discussed drug interactions and side effects including but not limited excessive drowsiness and altered mental states. We also discussed that there is always a risk not just to her but also people around her. she has been encouraged to call the office with any questions or concerns that should arise related to todays visit.  No orders  of the defined types were placed in this encounter.    Time spent: 73  I have personally obtained a history, examined the patient, evaluated laboratory and imaging results, formulated the assessment and plan and placed orders.    Jasmine DELENA Bathe, MD [facility] Pulmonary and Critical Care Sleep medicine

## 2024-07-25 NOTE — Patient Instructions (Signed)

## 2024-08-07 ENCOUNTER — Telehealth: Payer: Self-pay | Admitting: Internal Medicine

## 2024-08-07 NOTE — Telephone Encounter (Signed)
 SS order emailed to [facility]-Toni

## 2024-09-07 ENCOUNTER — Telehealth: Payer: Self-pay | Admitting: Internal Medicine

## 2024-09-07 NOTE — Telephone Encounter (Signed)
 SS appointment 09/13/24 @ [facility]-Toni

## 2024-10-31 ENCOUNTER — Ambulatory Visit: Admitting: Internal Medicine

## 2024-11-27 ENCOUNTER — Telehealth: Payer: Self-pay | Admitting: Internal Medicine

## 2024-11-27 NOTE — Telephone Encounter (Signed)
 Received referral from [place] Admin to schedule appt with DSK. Left patient for patient to return my call-Toni

## 2024-11-28 ENCOUNTER — Telehealth: Payer: Self-pay | Admitting: Internal Medicine

## 2024-11-28 NOTE — Telephone Encounter (Signed)
 Left another vm w/ patient to schedule appointment. S/w [place], she stated patient is traveling and has phone turned off. She will get in touch with her and have her call me-Toni

## 2024-12-12 ENCOUNTER — Telehealth: Payer: Self-pay | Admitting: Internal Medicine

## 2024-12-12 NOTE — Telephone Encounter (Signed)
 Attempted again to contact patient for appointment per [facility]. No answer. MB full-Toni
